# Patient Record
Sex: Male | Born: 1950 | Race: Asian | Hispanic: No | Marital: Married | Smoking: Former smoker
Health system: Southern US, Community
[De-identification: ages and names within clinical notes are randomized; demographics above are authoritative.]

## PROBLEM LIST (undated history)

## (undated) DIAGNOSIS — M1990 Unspecified osteoarthritis, unspecified site: Secondary | ICD-10-CM

## (undated) DIAGNOSIS — R339 Retention of urine, unspecified: Secondary | ICD-10-CM

## (undated) DIAGNOSIS — N400 Enlarged prostate without lower urinary tract symptoms: Secondary | ICD-10-CM

## (undated) DIAGNOSIS — Z8719 Personal history of other diseases of the digestive system: Secondary | ICD-10-CM

## (undated) DIAGNOSIS — Z972 Presence of dental prosthetic device (complete) (partial): Secondary | ICD-10-CM

## (undated) DIAGNOSIS — Z978 Presence of other specified devices: Secondary | ICD-10-CM

## (undated) DIAGNOSIS — K649 Unspecified hemorrhoids: Secondary | ICD-10-CM

## (undated) DIAGNOSIS — Z960 Presence of urogenital implants: Secondary | ICD-10-CM

## (undated) DIAGNOSIS — Z973 Presence of spectacles and contact lenses: Secondary | ICD-10-CM

## (undated) HISTORY — PX: LAPAROSCOPIC CHOLECYSTECTOMY: SUR755

---

## 2003-09-20 ENCOUNTER — Inpatient Hospital Stay (HOSPITAL_COMMUNITY): Admission: EM | Admit: 2003-09-20 | Discharge: 2003-10-04 | Payer: Self-pay | Admitting: Emergency Medicine

## 2003-09-20 ENCOUNTER — Ambulatory Visit (HOSPITAL_COMMUNITY): Admission: RE | Admit: 2003-09-20 | Discharge: 2003-09-20 | Payer: Self-pay | Admitting: Family Medicine

## 2003-09-20 ENCOUNTER — Encounter: Payer: Self-pay | Admitting: Family Medicine

## 2003-09-22 ENCOUNTER — Encounter: Payer: Self-pay | Admitting: Surgery

## 2003-09-25 ENCOUNTER — Encounter: Payer: Self-pay | Admitting: Surgery

## 2003-09-27 ENCOUNTER — Encounter: Payer: Self-pay | Admitting: Surgery

## 2003-09-28 ENCOUNTER — Encounter: Payer: Self-pay | Admitting: Surgery

## 2003-10-01 ENCOUNTER — Encounter: Payer: Self-pay | Admitting: General Surgery

## 2003-10-03 ENCOUNTER — Encounter: Payer: Self-pay | Admitting: Surgery

## 2005-12-07 ENCOUNTER — Ambulatory Visit (HOSPITAL_COMMUNITY): Admission: RE | Admit: 2005-12-07 | Discharge: 2005-12-07 | Payer: Self-pay | Admitting: Gastroenterology

## 2013-09-10 ENCOUNTER — Ambulatory Visit: Payer: No Typology Code available for payment source | Attending: Family Medicine | Admitting: Internal Medicine

## 2013-09-10 ENCOUNTER — Encounter: Payer: Self-pay | Admitting: Internal Medicine

## 2013-09-10 VITALS — BP 144/81 | HR 64 | Temp 98.0°F | Resp 14 | Ht 64.0 in | Wt 130.0 lb

## 2013-09-10 DIAGNOSIS — G8929 Other chronic pain: Secondary | ICD-10-CM | POA: Insufficient documentation

## 2013-09-10 DIAGNOSIS — N400 Enlarged prostate without lower urinary tract symptoms: Secondary | ICD-10-CM

## 2013-09-10 DIAGNOSIS — M545 Low back pain, unspecified: Secondary | ICD-10-CM | POA: Insufficient documentation

## 2013-09-10 MED ORDER — NICOTINE 21 MG/24HR TD PT24: 1.0000 | MEDICATED_PATCH | TRANSDERMAL | Status: DC

## 2013-09-10 MED ORDER — TAMSULOSIN HCL 0.4 MG PO CAPS: 0.4000 mg | ORAL_CAPSULE | Freq: Every day | ORAL | Status: DC

## 2013-09-10 MED ORDER — NAPROXEN 500 MG PO TABS: 250.0000 mg | ORAL_TABLET | Freq: Two times a day (BID) | ORAL | Status: DC | PRN

## 2013-09-10 NOTE — Progress Notes (Signed)
Pt is here to establish care. Pt reports that for 2 weeks he has pain in his lower back.

## 2013-09-10 NOTE — Progress Notes (Signed)
Patient ID: Jamie Short, male   DOB: July 03, 1951, 62 y.o.   MRN: [id number] Patient Demographics  Jamie Short, is a 62 y.o. male  WUJ:[id number]  NFA:[id number]  DOB - 1951/02/01  Chief Complaint  Patient presents with  . Establish Care        Subjective:   Jamie Short today is here to establish primary care. Patient is a 62 year old male with no significant past medical history except back pain, BPH presented to establish care He states that his back hurts on sitting down for more than an hour. States that he went to [place] 2 years ago and he was told that he had calcium buildup in his spine. He was also given medication for his prostate which helped his urination. He states that he had urinary hesitancy and urgency.  Patient has No headache, No chest pain, No abdominal pain - No Nausea, No new weakness tingling or numbness, No Cough - SOB.   Objective:    Filed Vitals:   09/10/13 1659  BP: 144/81  Pulse: 64  Temp: 98 F (36.7 C)  TempSrc: Oral  Resp: 14  Height: 5\' 4"  (1.626 m)  Weight: 130 lb (58.968 kg)  SpO2: 97%     ALLERGIES:  Not on File  PAST MEDICAL HISTORY: No past medical history on file.  PAST SURGICAL HISTORY: No past surgical history on file.  FAMILY HISTORY: No family history on file.  MEDICATIONS AT HOME: Prior to Admission medications   Medication Sig Start Date End Date Taking? Authorizing Provider  naproxen (NAPROSYN) 500 MG tablet Take 0.5 tablets (250 mg total) by mouth 2 (two) times daily as needed (FOR PAIN). 09/10/13   Ripudeep Jenna Luo, MD  nicotine (NICODERM CQ) 21 mg/24hr patch Place 1 patch onto the skin daily. 09/10/13   Ripudeep Jenna Luo, MD  tamsulosin (FLOMAX) 0.4 MG CAPS capsule Take 1 capsule (0.4 mg total) by mouth daily. 09/10/13   Ripudeep Jenna Luo, MD    REVIEW OF SYSTEMS:  Constitutional:   No   Fevers, chills, fatigue.  HEENT:    No headaches, Sore throat,   Cardio-vascular: No chest pain,  Orthopnea, swelling in lower extremities,  anasarca, palpitations  GI:  No abdominal pain, nausea, vomiting, diarrhea  Resp: No shortness of breath,  No coughing up of blood.No cough.No wheezing.  Skin:  no rash or lesions.  GU:  no dysuria, change in color of urine, no urgency or frequency.  No flank pain.  Musculoskeletal: No joint pain or swelling.  No decreased range of motion.   No spinal tenderness please see history of present illness  Psych: No change in mood or affect. No depression or anxiety.  No memory loss.   Exam  General appearance :Awake, alert, NAD, Speech Clear. HEENT: Atraumatic and Normocephalic, PERLA Neck: supple, no JVD. No cervical lymphadenopathy.  Chest: clear to auscultation bilaterally, no wheezing, rales or rhonchi CVS: S1 S2 regular, no murmurs.  Abdomen: soft, NBS, NT, ND, no gaurding, rigidity or rebound. Extremities: No cyanosis, clubbing, B/L Lower Ext shows no edema,  Neurology: Awake alert, and oriented X 3, CN II-XII intact, Non focal Skin:No Rash or lesions Wounds: N/A Back no spinal tenderness noted, patient had Lidoderm patch.   Data Review   Basic Metabolic Panel: No results found for this basename: NA, K, CL, CO2, GLUCOSE, BUN, CREATININE, CALCIUM, MG, PHOS,  in the last 168 hours Liver Function Tests: No results found for this basename: AST, ALT, ALKPHOS, BILITOT, PROT, ALBUMIN,  in the last 168 hours  CBC: No results found for this basename: WBC, NEUTROABS, HGB, HCT, MCV, PLT,  in the last 168 hours ------------------------------------------------------------------------------------------------------------------ No results found for this basename: HGBA1C,  in the last 72 hours ------------------------------------------------------------------------------------------------------------------ No results found for this basename: CHOL, HDL, LDLCALC, TRIG, CHOLHDL, LDLDIRECT,  in the last 72  hours ------------------------------------------------------------------------------------------------------------------ No results found for this basename: TSH, T4TOTAL, FREET3, T3FREE, THYROIDAB,  in the last 72 hours ------------------------------------------------------------------------------------------------------------------ No results found for this basename: VITAMINB12, FOLATE, FERRITIN, TIBC, IRON, RETICCTPCT,  in the last 72 hours  Coagulation profile  No results found for this basename: INR, PROTIME,  in the last 168 hours    Assessment & Plan   Active Problems:   Chronic Thoraco-lumbar spine back pain Obtain x-rays of the thoracic and lumbar spine. Placed on Naprosyn 250 mg by mouth BID PRN for pain  BPH: Obtain PSA, place on Flomax    Health screening : next visit  Recommendations:Ordered CBC, CMET, lipid panel, PSA, vitamin D level    Follow-up in 1 MONTH   RAI,RIPUDEEP M.D. 09/10/2013, 5:15 PM

## 2013-10-10 ENCOUNTER — Ambulatory Visit: Payer: No Typology Code available for payment source | Attending: Internal Medicine

## 2013-10-10 DIAGNOSIS — N400 Enlarged prostate without lower urinary tract symptoms: Secondary | ICD-10-CM

## 2013-10-10 DIAGNOSIS — M545 Low back pain: Secondary | ICD-10-CM

## 2013-10-10 LAB — COMPREHENSIVE METABOLIC PANEL
ALT: 18 U/L (ref 0–53)
AST: 19 U/L (ref 0–37)
Albumin: 4.2 g/dL (ref 3.5–5.2)
BUN: 24 mg/dL — ABNORMAL HIGH (ref 6–23)
Chloride: 105 mEq/L (ref 96–112)
Creat: 0.86 mg/dL (ref 0.50–1.35)
Glucose, Bld: 89 mg/dL (ref 70–99)
Potassium: 4.5 mEq/L (ref 3.5–5.3)
Total Bilirubin: 0.6 mg/dL (ref 0.3–1.2)

## 2013-10-10 LAB — CBC WITH DIFFERENTIAL/PLATELET
Basophils Absolute: 0.1 10*3/uL (ref 0.0–0.1)
Eosinophils Absolute: 0.9 10*3/uL — ABNORMAL HIGH (ref 0.0–0.7)
Eosinophils Relative: 9 % — ABNORMAL HIGH (ref 0–5)
MCH: 31.3 pg (ref 26.0–34.0)
MCHC: 34.3 g/dL (ref 30.0–36.0)
MCV: 91.2 fL (ref 78.0–100.0)
Monocytes Absolute: 0.7 10*3/uL (ref 0.1–1.0)
Neutro Abs: 7.4 10*3/uL (ref 1.7–7.7)
Platelets: 299 10*3/uL (ref 150–400)
RDW: 14.1 % (ref 11.5–15.5)

## 2013-10-10 LAB — PSA: PSA: 3.62 ng/mL (ref <=4.00)

## 2013-10-10 LAB — LIPID PANEL: LDL Cholesterol: 96 mg/dL (ref 0–99)

## 2013-10-11 LAB — VITAMIN D 25 HYDROXY (VIT D DEFICIENCY, FRACTURES): Vit D, 25-Hydroxy: 34 ng/mL (ref 30–89)

## 2013-10-13 ENCOUNTER — Ambulatory Visit: Payer: No Typology Code available for payment source | Attending: Internal Medicine | Admitting: Internal Medicine

## 2013-10-13 ENCOUNTER — Encounter: Payer: Self-pay | Admitting: Internal Medicine

## 2013-10-13 ENCOUNTER — Ambulatory Visit: Payer: No Typology Code available for payment source

## 2013-10-13 DIAGNOSIS — M545 Low back pain: Secondary | ICD-10-CM

## 2013-10-13 NOTE — Progress Notes (Signed)
Patient here for follow up on his back pain And for lab results

## 2013-10-13 NOTE — Progress Notes (Signed)
Patient ID: Jamie Short, male   DOB: 1951-06-02, 62 y.o.   MRN: [id number] Patient Demographics  Jamie Short, is a 62 y.o. male  WUJ:[id number]  NFA:[id number]  DOB - 08/09/1951  Chief Complaint  Patient presents with  . Follow-up        Subjective:   Jamie Short is a 62 y.o. male here today for a follow up visit. Patient wants to know the results of labs recently ordered. He is yet to go for his x-ray. No new complaints today. Patient claims pain is less. He is eating and drinking well. He still continue to smoke half a pack of cigarettes per day though he is trying to quit. He has prescription for nicotine patch. Patient has No headache, No chest pain, No abdominal pain - No Nausea, No new weakness tingling or numbness, No Cough - SOB.  ALLERGIES: No Known Allergies  PAST MEDICAL HISTORY: History reviewed. No pertinent past medical history.  MEDICATIONS AT HOME: Prior to Admission medications   Medication Sig Start Date End Date Taking? Authorizing Provider  naproxen (NAPROSYN) 500 MG tablet Take 0.5 tablets (250 mg total) by mouth 2 (two) times daily as needed (FOR PAIN). 09/10/13   Ripudeep Jenna Luo, MD  nicotine (NICODERM CQ) 21 mg/24hr patch Place 1 patch onto the skin daily. 09/10/13   Ripudeep Jenna Luo, MD  tamsulosin (FLOMAX) 0.4 MG CAPS capsule Take 1 capsule (0.4 mg total) by mouth daily. 09/10/13   Ripudeep Jenna Luo, MD     Objective:   There were no vitals filed for this visit.  Exam General appearance : Awake, alert, not in any distress. Speech Clear. Not toxic looking HEENT: Atraumatic and Normocephalic, pupils equally reactive to light and accomodation Neck: supple, no JVD. No cervical lymphadenopathy.  Chest:Good air entry bilaterally, no added sounds  CVS: S1 S2 regular, no murmurs.  Abdomen: Bowel sounds present, Non tender and not distended with no gaurding, rigidity or rebound. Extremities: B/L Lower Ext shows no edema, both legs are warm to touch Neurology: Awake alert, and  oriented X 3, CN II-XII intact, Non focal Skin:No Rash Wounds:N/A   Data Review   CBC  Recent Labs Lab 10/10/13 0926  WBC 10.5  HGB 16.0  HCT 46.7  PLT 299  MCV 91.2  MCH 31.3  MCHC 34.3  RDW 14.1  LYMPHSABS 1.4  MONOABS 0.7  EOSABS 0.9*  BASOSABS 0.1    Chemistries    Recent Labs Lab 10/10/13 0926  NA 141  K 4.5  CL 105  CO2 29  GLUCOSE 89  BUN 24*  CREATININE 0.86  CALCIUM 9.6  AST 19  ALT 18  ALKPHOS 63  BILITOT 0.6   ------------------------------------------------------------------------------------------------------------------ No results found for this basename: HGBA1C,  in the last 72 hours ------------------------------------------------------------------------------------------------------------------ No results found for this basename: CHOL, HDL, LDLCALC, TRIG, CHOLHDL, LDLDIRECT,  in the last 72 hours ------------------------------------------------------------------------------------------------------------------ No results found for this basename: TSH, T4TOTAL, FREET3, T3FREE, THYROIDAB,  in the last 72 hours ------------------------------------------------------------------------------------------------------------------ No results found for this basename: VITAMINB12, FOLATE, FERRITIN, TIBC, IRON, RETICCTPCT,  in the last 72 hours  Coagulation profile  No results found for this basename: INR, PROTIME,  in the last 168 hours    Assessment & Plan   Patient Active Problem List   Diagnosis Date Noted  . Back pain of thoracolumbar region 09/10/2013  . BPH (benign prostatic hypertrophy) 09/10/2013     Plan: We had reviewed the lab results together Patient encouraged to continue present  line of medications and management Patient encouraged to go for x-ray of the thoracic and lumbar spine as ordered  Patient has been counseled about nutrition and exercise  We will give shots for pneumonia and shingles in the next visit, patient  already got influenza shot   Follow up in 3 months or when necessary   The patient was given clear instructions to go to ER or return to medical center if symptoms don't improve, worsen or new problems develop. The patient verbalized understanding. The patient was told to call to get lab results if they haven't heard anything in the next week.    Jeanann Lewandowsky, MD, MHA, FACP, FAAP [facility] [place], [place] [phone]   10/13/2013, 4:04 PM

## 2014-01-12 ENCOUNTER — Ambulatory Visit: Payer: No Typology Code available for payment source | Admitting: Internal Medicine

## 2014-02-17 ENCOUNTER — Encounter (HOSPITAL_COMMUNITY): Payer: Self-pay | Admitting: Emergency Medicine

## 2014-02-17 ENCOUNTER — Emergency Department (HOSPITAL_COMMUNITY)
Admission: EM | Admit: 2014-02-17 | Discharge: 2014-02-17 | Disposition: A | Discharge status: Home / Self Care | Payer: No Typology Code available for payment source | Source: Home / Self Care | Attending: Family Medicine | Admitting: Family Medicine

## 2014-02-17 DIAGNOSIS — N400 Enlarged prostate without lower urinary tract symptoms: Secondary | ICD-10-CM

## 2014-02-17 DIAGNOSIS — K644 Residual hemorrhoidal skin tags: Secondary | ICD-10-CM

## 2014-02-17 LAB — POCT URINALYSIS DIP (DEVICE)
Bilirubin Urine: NEGATIVE
Glucose, UA: NEGATIVE mg/dL
Ketones, ur: NEGATIVE mg/dL
Leukocytes, UA: NEGATIVE
NITRITE: NEGATIVE
PROTEIN: NEGATIVE mg/dL
Specific Gravity, Urine: 1.02 (ref 1.005–1.030)
UROBILINOGEN UA: 0.2 mg/dL (ref 0.0–1.0)
pH: 7 (ref 5.0–8.0)

## 2014-02-17 MED ORDER — HYDROCORTISONE ACETATE 25 MG RE SUPP: 25.0000 mg | Freq: Two times a day (BID) | RECTAL | Status: DC | PRN

## 2014-02-17 MED ORDER — TAMSULOSIN HCL 0.4 MG PO CAPS: 0.4000 mg | ORAL_CAPSULE | Freq: Every day | ORAL | Status: DC

## 2014-02-17 NOTE — ED Notes (Signed)
Pt  Reports  Frequent  Scanty  painfull        Urination    For  sev  Days  As  Well  As  hemmoriod  Problems         History of  [rostate   Problems      Son is  Present  To  Interpret

## 2014-02-17 NOTE — ED Provider Notes (Signed)
CSN: [id number]     Arrival date & time 02/17/14  1005 History   First MD Initiated Contact with Patient 02/17/14 1035     Chief Complaint  Patient presents with  . Hemorrhoids   (Consider location/radiation/quality/duration/timing/severity/associated sxs/prior Treatment) Patient is a 63 y.o. male presenting with hematochezia. The history is provided by the patient.  Rectal Bleeding Quality:  Bright red Duration:  2 months Chronicity:  Chronic Context: hemorrhoids and rectal pain   Relieved by:  None tried Worsened by:  Nothing tried Associated symptoms: no fever and no vomiting     History reviewed. No pertinent past medical history. History reviewed. No pertinent past surgical history. History reviewed. No pertinent family history. History  Substance Use Topics  . Smoking status: Current Every Day Smoker -- 0.50 packs/day    Types: Cigarettes  . Smokeless tobacco: Not on file     Comment: smokes 10cigs/day  . Alcohol Use: No    Review of Systems  Constitutional: Negative.  Negative for fever.  Gastrointestinal: Positive for hematochezia. Negative for vomiting.  Genitourinary: Negative.     Allergies  Review of patient's allergies indicates no known allergies.  Home Medications   Current Outpatient Rx  Name  Route  Sig  Dispense  Refill  . hydrocortisone (ANUSOL-HC) 25 MG suppository   Rectal   Place 1 suppository (25 mg total) rectally 2 (two) times daily as needed for hemorrhoids or itching.   12 suppository   1   . naproxen (NAPROSYN) 500 MG tablet   Oral   Take 0.5 tablets (250 mg total) by mouth 2 (two) times daily as needed (FOR PAIN).   60 tablet   3   . nicotine (NICODERM CQ) 21 mg/24hr patch   Transdermal   Place 1 patch onto the skin daily.   28 patch   0   . tamsulosin (FLOMAX) 0.4 MG CAPS capsule   Oral   Take 1 capsule (0.4 mg total) by mouth daily.   30 capsule   3   . tamsulosin (FLOMAX) 0.4 MG CAPS capsule   Oral   Take 1 capsule  (0.4 mg total) by mouth daily after breakfast.   30 capsule   1    BP 140/72  Pulse 78  Temp(Src) 98.6 F (37 C) (Oral)  Resp 18  SpO2 98% Physical Exam  Nursing note and vitals reviewed. Constitutional: He is oriented to person, place, and time.  Genitourinary: Testes normal and penis normal. Rectal exam shows external hemorrhoid. Rectal exam shows no tenderness. Prostate is tender. Prostate is not enlarged.  Neurological: He is alert and oriented to person, place, and time.  Skin: Skin is warm and dry.    ED Course  Procedures (including critical care time) Labs Review Labs Reviewed  POCT URINALYSIS DIP (DEVICE) - Abnormal; Notable for the following:    Hgb urine dipstick TRACE (*)    All other components within normal limits   Imaging Review No results found.   MDM   1. BPH without urinary obstruction   2. External hemorrhoid        Billy Fischer, MD 02/17/14 1100

## 2014-02-24 ENCOUNTER — Emergency Department (HOSPITAL_COMMUNITY)
Admission: EM | Admit: 2014-02-24 | Discharge: 2014-02-24 | Disposition: A | Discharge status: Home / Self Care | Payer: No Typology Code available for payment source | Source: Home / Self Care

## 2014-02-24 ENCOUNTER — Encounter (HOSPITAL_COMMUNITY): Payer: Self-pay | Admitting: Emergency Medicine

## 2014-02-24 DIAGNOSIS — R238 Other skin changes: Secondary | ICD-10-CM

## 2014-02-24 DIAGNOSIS — T148XXA Other injury of unspecified body region, initial encounter: Secondary | ICD-10-CM

## 2014-02-24 HISTORY — DX: Benign prostatic hyperplasia without lower urinary tract symptoms: N40.0

## 2014-02-24 HISTORY — DX: Unspecified hemorrhoids: K64.9

## 2014-02-24 MED ORDER — TETANUS-DIPHTH-ACELL PERTUSSIS 5-2.5-18.5 LF-MCG/0.5 IM SUSP
INTRAMUSCULAR | Status: AC
  Filled 2014-02-24: qty 0.5

## 2014-02-24 MED ORDER — CEPHALEXIN 500 MG PO CAPS: 500.0000 mg | ORAL_CAPSULE | Freq: Two times a day (BID) | ORAL | Status: DC

## 2014-02-24 MED ORDER — TETANUS-DIPHTH-ACELL PERTUSSIS 5-2.5-18.5 LF-MCG/0.5 IM SUSP
0.5000 mL | Freq: Once | INTRAMUSCULAR | Status: AC
  Administered 2014-02-24: 0.5 mL via INTRAMUSCULAR
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
## 2014-02-24 NOTE — ED Notes (Signed)
Patient has multiple red areas to his back Caused by some type of culture therapy Has been this way since yesterday Has been using OTC antibiotic cream

## 2014-02-25 NOTE — ED Provider Notes (Signed)
Medical screening examination/treatment/procedure(s) were performed by a resident physician or non-physician practitioner and as the supervising physician I was immediately available for consultation/collaboration.  Lynne Leader, MD    Gregor Hams, MD 02/25/14 [phone]

## 2014-09-24 DIAGNOSIS — Z23 Encounter for immunization: Secondary | ICD-10-CM | POA: Diagnosis not present

## 2014-10-08 ENCOUNTER — Ambulatory Visit (HOSPITAL_COMMUNITY)
Admission: RE | Admit: 2014-10-08 | Discharge: 2014-10-08 | Disposition: A | Discharge status: Home / Self Care | Payer: Medicare Other | Source: Ambulatory Visit | Attending: Cardiology | Admitting: Cardiology

## 2014-10-08 ENCOUNTER — Encounter: Payer: Self-pay | Admitting: Internal Medicine

## 2014-10-08 ENCOUNTER — Ambulatory Visit: Payer: Medicare Other | Attending: Internal Medicine | Admitting: Internal Medicine

## 2014-10-08 ENCOUNTER — Other Ambulatory Visit: Payer: Self-pay

## 2014-10-08 VITALS — BP 135/79 | HR 72 | Temp 97.8°F | Resp 16 | Ht 64.0 in | Wt 138.0 lb

## 2014-10-08 DIAGNOSIS — R0602 Shortness of breath: Secondary | ICD-10-CM

## 2014-10-08 DIAGNOSIS — Z23 Encounter for immunization: Secondary | ICD-10-CM | POA: Diagnosis not present

## 2014-10-08 DIAGNOSIS — N400 Enlarged prostate without lower urinary tract symptoms: Secondary | ICD-10-CM

## 2014-10-08 DIAGNOSIS — Z0000 Encounter for general adult medical examination without abnormal findings: Secondary | ICD-10-CM | POA: Diagnosis not present

## 2014-10-08 DIAGNOSIS — Z1211 Encounter for screening for malignant neoplasm of colon: Secondary | ICD-10-CM

## 2014-10-08 DIAGNOSIS — M545 Low back pain: Secondary | ICD-10-CM | POA: Diagnosis not present

## 2014-10-08 DIAGNOSIS — I4510 Unspecified right bundle-branch block: Secondary | ICD-10-CM | POA: Diagnosis not present

## 2014-10-08 DIAGNOSIS — M546 Pain in thoracic spine: Principal | ICD-10-CM

## 2014-10-08 LAB — COMPLETE METABOLIC PANEL WITH GFR
ALT: 19 U/L (ref 0–53)
AST: 19 U/L (ref 0–37)
Albumin: 4.3 g/dL (ref 3.5–5.2)
Alkaline Phosphatase: 60 U/L (ref 39–117)
BUN: 19 mg/dL (ref 6–23)
CALCIUM: 9.3 mg/dL (ref 8.4–10.5)
CHLORIDE: 103 meq/L (ref 96–112)
CO2: 26 mEq/L (ref 19–32)
Creat: 0.92 mg/dL (ref 0.50–1.35)
GFR, EST NON AFRICAN AMERICAN: 87 mL/min
GFR, Est African American: >89 mL/min
GLUCOSE: 95 mg/dL (ref 70–99)
POTASSIUM: 4.6 meq/L (ref 3.5–5.3)
SODIUM: 138 meq/L (ref 135–145)
TOTAL PROTEIN: 7.1 g/dL (ref 6.0–8.3)
Total Bilirubin: 0.3 mg/dL (ref 0.2–1.2)

## 2014-10-08 MED ORDER — TAMSULOSIN HCL 0.4 MG PO CAPS: 0.4000 mg | ORAL_CAPSULE | Freq: Every day | ORAL | Status: DC

## 2014-10-08 MED ORDER — NAPROXEN 500 MG PO TABS: 250.0000 mg | ORAL_TABLET | Freq: Two times a day (BID) | ORAL | Status: DC | PRN

## 2014-10-08 NOTE — Progress Notes (Signed)
Pt is here c/o difficulty urinating. Pt states that he has pain and numbness in his lower back. Pt is requesting a pneumonia shot.

## 2014-10-08 NOTE — Progress Notes (Signed)
Patient ID: Jamie Short, male   DOB: 22-Jul-1951, 63 y.o.   MRN: [id number]   Jamie Short, is a 63 y.o. male  FYB:[id number]  NID:[id number]  DOB - [birth year]  Chief Complaint  Patient presents with  . Follow-up        Subjective:   Jamie Short is a 63 y.o. male here today for a follow up visit. Patient has history of BPH here today with complaint of difficulty urinating. He states that he has pain in his lower back, chronic. Also complaining of some shortness of breath and cough requesting Pneumovax. Patient has not had colonoscopy. Patient is on Anusol suppository for hemorrhoids. Patient has No headache, No chest pain, No abdominal pain - No Nausea, No new weakness tingling or numbness, No Cough - SOB.  Problem  Colon Cancer Screening    ALLERGIES: No Known Allergies  PAST MEDICAL HISTORY: Past Medical History  Diagnosis Date  . Hemorrhoids   . BPH (benign prostatic hyperplasia)     MEDICATIONS AT HOME: Prior to Admission medications   Medication Sig Start Date End Date Taking? Authorizing Provider  hydrocortisone (ANUSOL-HC) 25 MG suppository Place 1 suppository (25 mg total) rectally 2 (two) times daily as needed for hemorrhoids or itching. 02/17/14   Billy Fischer, MD  naproxen (NAPROSYN) 500 MG tablet Take 0.5 tablets (250 mg total) by mouth 2 (two) times daily as needed (FOR PAIN). 10/08/14   Tresa Garter, MD  nicotine (NICODERM CQ) 21 mg/24hr patch Place 1 patch onto the skin daily. 09/10/13   Ripudeep Krystal Eaton, MD  tamsulosin (FLOMAX) 0.4 MG CAPS capsule Take 1 capsule (0.4 mg total) by mouth daily after breakfast. 10/08/14   Tresa Garter, MD     Objective:   Filed Vitals:   10/08/14 1604  BP: 135/79  Pulse: 72  Temp: 97.8 F (36.6 C)  TempSrc: Oral  Resp: 16  Height: 5\' 4"  (1.626 m)  Weight: 138 lb (62.596 kg)  SpO2: 96%    Exam General appearance : Awake, alert, not in any distress. Speech Clear. Not toxic looking HEENT: Atraumatic and Normocephalic,  pupils equally reactive to light and accomodation Neck: supple, no JVD. No cervical lymphadenopathy.  Chest:Good air entry bilaterally, no added sounds  CVS: S1 S2 regular, no murmurs.  Abdomen: Bowel sounds present, Non tender and not distended with no gaurding, rigidity or rebound. Extremities: B/L Lower Ext shows no edema, both legs are warm to touch Neurology: Awake alert, and oriented X 3, CN II-XII intact, Non focal  Data Review No results found for this basename: HGBA1C     Assessment & Plan   1. Back pain of thoracolumbar region  - naproxen (NAPROSYN) 500 MG tablet; Take 0.5 tablets (250 mg total) by mouth 2 (two) times daily as needed (FOR PAIN).  Dispense: 60 tablet; Refill: 3  2. BPH (benign prostatic hypertrophy)  - Urinalysis, Complete - COMPLETE METABOLIC PANEL WITH GFR - tamsulosin (FLOMAX) 0.4 MG CAPS capsule; Take 1 capsule (0.4 mg total) by mouth daily after breakfast.  Dispense: 90 capsule; Refill: 3  3. Colon cancer screening  - HM COLONOSCOPY - Ambulatory referral to Gastroenterology  4. SOB (shortness of breath)  - DG Chest 2 View; Future  Interpreter was used to communicate directly with patient for the entire encounter including providing detailed patient instructions.   Return in about 6 months (around 04/09/2015), or if symptoms worsen or fail to improve, for Follow up Pain and comorbidities, BPH.  The  patient was given clear instructions to go to ER or return to medical center if symptoms don't improve, worsen or new problems develop. The patient verbalized understanding. The patient was told to call to get lab results if they haven't heard anything in the next week.   This note has been created with Surveyor, quantity. Any transcriptional errors are unintentional.    Angelica Chessman, MD, [place], [place], [place] and [place], [place]   10/08/2014, 4:47  PM

## 2014-10-09 LAB — URINALYSIS, COMPLETE
Bacteria, UA: NONE SEEN
Bilirubin Urine: NEGATIVE
Casts: NONE SEEN
Glucose, UA: NEGATIVE mg/dL
HGB URINE DIPSTICK: NEGATIVE
Ketones, ur: NEGATIVE mg/dL
Leukocytes, UA: NEGATIVE
NITRITE: NEGATIVE
PROTEIN: NEGATIVE mg/dL
SQUAMOUS EPITHELIAL / LPF: NONE SEEN
Specific Gravity, Urine: 1.025 (ref 1.005–1.030)
UROBILINOGEN UA: 0.2 mg/dL (ref 0.0–1.0)
pH: 6.5 (ref 5.0–8.0)

## 2015-02-14 ENCOUNTER — Emergency Department (HOSPITAL_COMMUNITY)
Admission: EM | Admit: 2015-02-14 | Discharge: 2015-02-14 | Disposition: A | Discharge status: Home / Self Care | Payer: Medicare Other | Attending: Emergency Medicine | Admitting: Emergency Medicine

## 2015-02-14 ENCOUNTER — Encounter (HOSPITAL_COMMUNITY): Payer: Self-pay | Admitting: Emergency Medicine

## 2015-02-14 DIAGNOSIS — Z720 Tobacco use: Secondary | ICD-10-CM | POA: Diagnosis not present

## 2015-02-14 DIAGNOSIS — Z7952 Long term (current) use of systemic steroids: Secondary | ICD-10-CM | POA: Insufficient documentation

## 2015-02-14 DIAGNOSIS — N400 Enlarged prostate without lower urinary tract symptoms: Secondary | ICD-10-CM | POA: Insufficient documentation

## 2015-02-14 DIAGNOSIS — R32 Unspecified urinary incontinence: Secondary | ICD-10-CM | POA: Diagnosis not present

## 2015-02-14 DIAGNOSIS — R339 Retention of urine, unspecified: Secondary | ICD-10-CM | POA: Diagnosis not present

## 2015-02-14 DIAGNOSIS — Z79899 Other long term (current) drug therapy: Secondary | ICD-10-CM | POA: Insufficient documentation

## 2015-02-14 DIAGNOSIS — R109 Unspecified abdominal pain: Secondary | ICD-10-CM | POA: Diagnosis not present

## 2015-02-14 DIAGNOSIS — Z8719 Personal history of other diseases of the digestive system: Secondary | ICD-10-CM | POA: Diagnosis not present

## 2015-02-14 LAB — URINE MICROSCOPIC-ADD ON

## 2015-02-14 LAB — URINALYSIS, ROUTINE W REFLEX MICROSCOPIC
BILIRUBIN URINE: NEGATIVE
Glucose, UA: NEGATIVE mg/dL
Ketones, ur: NEGATIVE mg/dL
Leukocytes, UA: NEGATIVE
NITRITE: NEGATIVE
PH: 7 (ref 5.0–8.0)
PROTEIN: NEGATIVE mg/dL
Specific Gravity, Urine: 1.018 (ref 1.005–1.030)
UROBILINOGEN UA: 0.2 mg/dL (ref 0.0–1.0)

## 2015-02-14 LAB — I-STAT CHEM 8, ED
BUN: 23 mg/dL (ref 6–23)
CALCIUM ION: 1.17 mmol/L (ref 1.13–1.30)
Chloride: 102 mmol/L (ref 96–112)
Creatinine, Ser: 0.9 mg/dL (ref 0.50–1.35)
GLUCOSE: 115 mg/dL — AB (ref 70–99)
HCT: 49 % (ref 39.0–52.0)
Hemoglobin: 16.7 g/dL (ref 13.0–17.0)
Potassium: 3.8 mmol/L (ref 3.5–5.1)
Sodium: 140 mmol/L (ref 135–145)
TCO2: 25 mmol/L (ref 0–100)

## 2015-02-14 NOTE — ED Notes (Signed)
Per EMS-patient here with complaints of lower abd pain, difficulty urinating. States that last time urinated was yesterday afternoon. Denies blood in urine. Hx of prostate issues.

## 2015-02-14 NOTE — ED Provider Notes (Signed)
CSN: [id number]     Arrival date & time 02/14/15  1159 History   First MD Initiated Contact with Patient 02/14/15 1205     Chief Complaint  Patient presents with  . Abdominal Pain  . Urinary Retention     (Consider location/radiation/quality/duration/timing/severity/associated sxs/prior Treatment) HPI Comments: Patient here complaining of urinary retention 24 hours. History of enlarged prostate and has just run out of his Flomax. No flank pain or fever. No vomiting or diarrhea. Does note suprapubic fullness. Symptoms persistent and he has noted some dribbling without dysuria. Called EMS was transported here. No history of hematuria  Patient is a 64 y.o. male presenting with abdominal pain. The history is provided by the patient and a relative.  Abdominal Pain   Past Medical History  Diagnosis Date  . Hemorrhoids   . BPH (benign prostatic hyperplasia)    History reviewed. No pertinent past surgical history. No family history on file. History  Substance Use Topics  . Smoking status: Current Every Day Smoker -- 0.50 packs/day    Types: Cigarettes  . Smokeless tobacco: Not on file     Comment: smokes 10cigs/day  . Alcohol Use: No    Review of Systems  Gastrointestinal: Positive for abdominal pain.  All other systems reviewed and are negative.     Allergies  Review of patient's allergies indicates no known allergies.  Home Medications   Prior to Admission medications   Medication Sig Start Date End Date Taking? Authorizing Provider  hydrocortisone (ANUSOL-HC) 25 MG suppository Place 1 suppository (25 mg total) rectally 2 (two) times daily as needed for hemorrhoids or itching. 02/17/14   Billy Fischer, MD  naproxen (NAPROSYN) 500 MG tablet Take 0.5 tablets (250 mg total) by mouth 2 (two) times daily as needed (FOR PAIN). 10/08/14   Tresa Garter, MD  nicotine (NICODERM CQ) 21 mg/24hr patch Place 1 patch onto the skin daily. 09/10/13   Ripudeep Krystal Eaton, MD  tamsulosin  (FLOMAX) 0.4 MG CAPS capsule Take 1 capsule (0.4 mg total) by mouth daily after breakfast. 10/08/14   Tresa Garter, MD   There were no vitals taken for this visit. Physical Exam  Constitutional: He is oriented to person, place, and time. He appears well-developed and well-nourished.  Non-toxic appearance. No distress.  HENT:  Head: Normocephalic and atraumatic.  Eyes: Conjunctivae, EOM and lids are normal. Pupils are equal, round, and reactive to light.  Neck: Normal range of motion. Neck supple. No tracheal deviation present. No thyroid mass present.  Cardiovascular: Normal rate, regular rhythm and normal heart sounds.  Exam reveals no gallop.   No murmur heard. Pulmonary/Chest: Effort normal and breath sounds normal. No stridor. No respiratory distress. He has no decreased breath sounds. He has no wheezes. He has no rhonchi. He has no rales.  Abdominal: Soft. Normal appearance and bowel sounds are normal. He exhibits no distension. There is tenderness in the suprapubic area. There is no rigidity, no rebound, no guarding and no CVA tenderness.    Musculoskeletal: Normal range of motion. He exhibits no edema or tenderness.  Neurological: He is alert and oriented to person, place, and time. He has normal strength. No cranial nerve deficit or sensory deficit. GCS eye subscore is 4. GCS verbal subscore is 5. GCS motor subscore is 6.  Skin: Skin is warm and dry. No abrasion and no rash noted.  Psychiatric: He has a normal mood and affect. His speech is normal and behavior is normal.  Nursing note  and vitals reviewed.   ED Course  Procedures (including critical care time) Labs Review Labs Reviewed  URINE CULTURE  URINALYSIS, ROUTINE W REFLEX MICROSCOPIC  I-STAT CHEM 8, ED    Imaging Review No results found.   EKG Interpretation None      MDM   Final diagnoses:  None    Foley catheter placed by nursing and copious amounts of urine drained. Patient's renal function  normal. Urinalysis negative for infection. Will be given referral to urology and will go home with Foley leg bag    Leota Jacobsen, MD 02/14/15 1410

## 2015-02-14 NOTE — Discharge Instructions (Signed)
Acute Urinary Retention °Acute urinary retention is the temporary inability to urinate. °This is a common problem in older men. As men age their prostates become larger and block the flow of urine from the bladder. This is usually a problem that has come on gradually.  °HOME CARE INSTRUCTIONS °If you are sent home with a Foley catheter and a drainage system, you will need to discuss the best course of action with your health care provider. While the catheter is in, maintain a good intake of fluids. Keep the drainage bag emptied and lower than your catheter. This is so that contaminated urine will not flow back into your bladder, which could lead to a urinary tract infection. °There are two main types of drainage bags. One is a large bag that usually is used at night. It has a good capacity that will allow you to sleep through the night without having to empty it. The second type is called a leg bag. It has a smaller capacity, so it needs to be emptied more frequently. However, the main advantage is that it can be attached by a leg strap and can go underneath your clothing, allowing you the freedom to move about or leave your home. °Only take over-the-counter or prescription medicines for pain, discomfort, or fever as directed by your health care provider.  °SEEK MEDICAL CARE IF: °· You develop a low-grade fever. °· You experience spasms or leakage of urine with the spasms. °SEEK IMMEDIATE MEDICAL CARE IF:  °1. You develop chills or fever. °2. Your catheter stops draining urine. °3. Your catheter falls out. °4. You start to develop increased bleeding that does not respond to rest and increased fluid intake. °MAKE SURE YOU: °1. Understand these instructions. °2. Will watch your condition. °3. Will get help right away if you are not doing well or get worse. °Document Released: 03/05/2001 Document Revised: 12/02/2013 Document Reviewed: 05/08/2013 °ExitCare® Patient Information ©2015 [employer]. This information is  not intended to replace advice given to you by your health care provider. Make sure you discuss any questions you have with your health care provider. °Foley Catheter Care °A Foley catheter is a soft, flexible tube that is placed into the bladder to drain urine. A Foley catheter may be inserted if: °· You leak urine or are not able to control when you urinate (urinary incontinence). °· You are not able to urinate when you need to (urinary retention). °· You had prostate surgery or surgery on the genitals. °· You have certain medical conditions, such as multiple sclerosis, dementia, or a spinal cord injury. °If you are going home with a Foley catheter in place, follow the instructions below. °TAKING CARE OF THE CATHETER °5. Wash your hands with soap and water. °6. Using mild soap and warm water on a clean washcloth: °· Clean the area on your body closest to the catheter insertion site using a circular motion, moving away from the catheter. Never wipe toward the catheter because this could sweep bacteria up into the urethra and cause infection. °· Remove all traces of soap. Pat the area dry with a clean towel. For males, reposition the foreskin. °7. Attach the catheter to your leg so there is no tension on the catheter. Use adhesive tape or a leg strap. If you are using adhesive tape, remove any sticky residue left behind by the previous tape you used. °8. Keep the drainage bag below the level of the bladder, but keep it off the floor. °9. Check throughout   the day to be sure the catheter is working and urine is draining freely. Make sure the tubing does not become kinked. °10. Do not pull on the catheter or try to remove it. Pulling could damage internal tissues. °TAKING CARE OF THE DRAINAGE BAGS °You will be given two drainage bags to take home. One is a large overnight drainage bag, and the other is a smaller leg bag that fits underneath clothing. You may wear the overnight bag at any time, but you should never wear  the smaller leg bag at night. Follow the instructions below for how to empty, change, and clean your drainage bags. °Emptying the Drainage Bag °You must empty your drainage bag when it is  -½ full or at least 2-3 times a day. °4. Wash your hands with soap and water. °5. Keep the drainage bag below your hips, below the level of your bladder. This stops urine from going back into the tubing and into your bladder. °6. Hold the dirty bag over the toilet or a clean container. °7. Open the pour spout at the bottom of the bag and empty the urine into the toilet or container. Do not let the pour spout touch the toilet, container, or any other surface. Doing so can place bacteria on the bag, which can cause an infection. °8. Clean the pour spout with a gauze pad or cotton ball that has rubbing alcohol on it. °9. Close the pour spout. °10. Attach the bag to your leg with adhesive tape or a leg strap. °11. Wash your hands well. °Changing the Drainage Bag °Change your drainage bag once a month or sooner if it starts to smell bad or look dirty. Below are steps to follow when changing the drainage bag. °1. Wash your hands with soap and water. °2. Pinch off the rubber catheter so that urine does not spill out. °3. Disconnect the catheter tube from the drainage tube at the connection valve. Do not let the tubes touch any surface. °4. Clean the end of the catheter tube with an alcohol wipe. Use a different alcohol wipe to clean the end of the drainage tube. °5. Connect the catheter tube to the drainage tube of the clean drainage bag. °6. Attach the new bag to the leg with adhesive tape or a leg strap. Avoid attaching the new bag too tightly. °7. Wash your hands well. °Cleaning the Drainage Bag °1. Wash your hands with soap and water. °2. Wash the bag in warm, soapy water. °3. Rinse the bag thoroughly with warm water. °4. Fill the bag with a solution of white vinegar and water (1 cup vinegar to 1 qt warm water [.2 L vinegar to 1 L  warm water]). Close the bag and soak it for 30 minutes in the solution. °5. Rinse the bag with warm water. °6. Hang the bag to dry with the pour spout open and hanging downward. °7. Store the clean bag (once it is dry) in a clean plastic bag. °8. Wash your hands well. °PREVENTING INFECTION °· Wash your hands before and after handling your catheter. °· Take showers daily and wash the area where the catheter enters your body. Do not take baths. Replace wet leg straps with dry ones, if this applies. °· Do not use powders, sprays, or lotions on the genital area. Only use creams, lotions, or ointments as directed by your caregiver. °· For females, wipe from front to back after each bowel movement. °· Drink enough fluids to keep your urine   clear or pale yellow unless you have a fluid restriction. °· Do not let the drainage bag or tubing touch or lie on the floor. °· Wear cotton underwear to absorb moisture and to keep your skin drier. °SEEK MEDICAL CARE IF:  °· Your urine is cloudy or smells unusually bad. °· Your catheter becomes clogged. °· You are not draining urine into the bag or your bladder feels full. °· Your catheter starts to leak. °SEEK IMMEDIATE MEDICAL CARE IF:  °· You have pain, swelling, redness, or pus where the catheter enters the body. °· You have pain in the abdomen, legs, lower back, or bladder. °· You have a fever. °· You see blood fill the catheter, or your urine is pink or red. °· You have nausea, vomiting, or chills. °· Your catheter gets pulled out. °MAKE SURE YOU:  °· Understand these instructions. °· Will watch your condition. °· Will get help right away if you are not doing well or get worse. °Document Released: 11/27/2005 Document Revised: 04/13/2014 Document Reviewed: 11/18/2012 °ExitCare® Patient Information ©2015 [employer]. This information is not intended to replace advice given to you by your health care provider. Make sure you discuss any questions you have with your health care  provider. ° °

## 2015-02-14 NOTE — ED Notes (Signed)
Bed: WA06 Expected date:  Expected time:  Means of arrival:  Comments: EMS ABDOMINAL PAIN /URINARY RETENTION

## 2015-02-15 LAB — URINE CULTURE
CULTURE: NO GROWTH
Colony Count: NO GROWTH

## 2015-02-17 DIAGNOSIS — R350 Frequency of micturition: Secondary | ICD-10-CM | POA: Diagnosis not present

## 2015-02-17 DIAGNOSIS — R351 Nocturia: Secondary | ICD-10-CM | POA: Diagnosis not present

## 2015-02-17 DIAGNOSIS — R3912 Poor urinary stream: Secondary | ICD-10-CM | POA: Diagnosis not present

## 2015-02-17 DIAGNOSIS — R339 Retention of urine, unspecified: Secondary | ICD-10-CM | POA: Diagnosis not present

## 2015-03-04 DIAGNOSIS — R350 Frequency of micturition: Secondary | ICD-10-CM | POA: Diagnosis not present

## 2015-03-04 DIAGNOSIS — R339 Retention of urine, unspecified: Secondary | ICD-10-CM | POA: Diagnosis not present

## 2015-03-04 DIAGNOSIS — R351 Nocturia: Secondary | ICD-10-CM | POA: Diagnosis not present

## 2015-03-04 DIAGNOSIS — R3912 Poor urinary stream: Secondary | ICD-10-CM | POA: Diagnosis not present

## 2015-04-08 DIAGNOSIS — R339 Retention of urine, unspecified: Secondary | ICD-10-CM | POA: Diagnosis not present

## 2015-04-08 DIAGNOSIS — R3912 Poor urinary stream: Secondary | ICD-10-CM | POA: Diagnosis not present

## 2015-04-08 DIAGNOSIS — R351 Nocturia: Secondary | ICD-10-CM | POA: Diagnosis not present

## 2015-06-04 ENCOUNTER — Emergency Department (HOSPITAL_COMMUNITY)
Admission: EM | Admit: 2015-06-04 | Discharge: 2015-06-04 | Disposition: A | Discharge status: Home / Self Care | Payer: Medicare Other | Attending: Emergency Medicine | Admitting: Emergency Medicine

## 2015-06-04 ENCOUNTER — Encounter (HOSPITAL_COMMUNITY): Payer: Self-pay | Admitting: Emergency Medicine

## 2015-06-04 DIAGNOSIS — Z720 Tobacco use: Secondary | ICD-10-CM | POA: Diagnosis not present

## 2015-06-04 DIAGNOSIS — Z79899 Other long term (current) drug therapy: Secondary | ICD-10-CM | POA: Diagnosis not present

## 2015-06-04 DIAGNOSIS — R338 Other retention of urine: Secondary | ICD-10-CM | POA: Diagnosis not present

## 2015-06-04 DIAGNOSIS — R339 Retention of urine, unspecified: Secondary | ICD-10-CM

## 2015-06-04 DIAGNOSIS — R6889 Other general symptoms and signs: Secondary | ICD-10-CM | POA: Diagnosis not present

## 2015-06-04 DIAGNOSIS — N401 Enlarged prostate with lower urinary tract symptoms: Secondary | ICD-10-CM | POA: Insufficient documentation

## 2015-06-04 DIAGNOSIS — Z8719 Personal history of other diseases of the digestive system: Secondary | ICD-10-CM | POA: Insufficient documentation

## 2015-06-04 DIAGNOSIS — N400 Enlarged prostate without lower urinary tract symptoms: Secondary | ICD-10-CM

## 2015-06-04 DIAGNOSIS — Z791 Long term (current) use of non-steroidal anti-inflammatories (NSAID): Secondary | ICD-10-CM | POA: Diagnosis not present

## 2015-06-04 LAB — URINALYSIS, ROUTINE W REFLEX MICROSCOPIC
Bilirubin Urine: NEGATIVE
Glucose, UA: NEGATIVE mg/dL
Ketones, ur: NEGATIVE mg/dL
LEUKOCYTES UA: NEGATIVE
Nitrite: NEGATIVE
PH: 7.5 (ref 5.0–8.0)
Protein, ur: NEGATIVE mg/dL
Specific Gravity, Urine: 1.014 (ref 1.005–1.030)
UROBILINOGEN UA: 0.2 mg/dL (ref 0.0–1.0)

## 2015-06-04 LAB — I-STAT CHEM 8, ED
BUN: 21 mg/dL — ABNORMAL HIGH (ref 6–20)
CALCIUM ION: 1.19 mmol/L (ref 1.13–1.30)
CHLORIDE: 105 mmol/L (ref 101–111)
Creatinine, Ser: 0.7 mg/dL (ref 0.61–1.24)
GLUCOSE: 106 mg/dL — AB (ref 65–99)
HEMATOCRIT: 49 % (ref 39.0–52.0)
HEMOGLOBIN: 16.7 g/dL (ref 13.0–17.0)
POTASSIUM: 3.7 mmol/L (ref 3.5–5.1)
Sodium: 138 mmol/L (ref 135–145)
TCO2: 23 mmol/L (ref 0–100)

## 2015-06-04 LAB — URINE MICROSCOPIC-ADD ON

## 2015-06-04 NOTE — ED Notes (Signed)
Bed: WA14 Expected date:  Expected time:  Means of arrival:  Comments: EMS urinary retention 

## 2015-06-04 NOTE — ED Notes (Signed)
Replaced foley bag with leg bag so pt can be discharged to go home

## 2015-06-04 NOTE — ED Notes (Signed)
Patient has had pain in pelvis area. Patient has not been able to urinate for two hours.

## 2015-06-04 NOTE — ED Provider Notes (Signed)
CSN: [id number]     Arrival date & time 06/04/15  0605 History   First MD Initiated Contact with Patient 06/04/15 [phone]     Chief Complaint  Patient presents with  . Urinary Retention    Patient has had pain in pelvis area. Patient has not been able to urinate for two hours.   Jamie Short is a 64 y.o. male with a history of BPH who presents to the ED complaining of suprapubic pain and unable to urinate since 0100 am this morning. The patient reports he last urinated around 0100 am this morning and he got up to urinate again, but was unable to urinate. He reports having suprapubic pain earlier that was relieved with foley cath placement.  The patient was seen in the emergency department on 02/14/2015 for the same urinary retention and is referred to [facility]. The patient reports he went to urologist but is unsure of the name urologist he saw. He reports he's been taking Flomax daily has not missed a dose. He reports he  Has trouble initiating stream of his hair regularly. He denies any current abdominal pain. Patient denies fevers, chills, dysuria, hematuria, rashes, abdominal pain, nausea, vomiting, testicle pain, penile pain, penile discharge, or rectal pain.   (Consider location/radiation/quality/duration/timing/severity/associated sxs/prior Treatment) The history is provided by the patient. A language interpreter was used.    Past Medical History  Diagnosis Date  . Hemorrhoids   . BPH (benign prostatic hyperplasia)    History reviewed. No pertinent past surgical history. History reviewed. No pertinent family history. History  Substance Use Topics  . Smoking status: Current Every Day Smoker -- 0.50 packs/day    Types: Cigarettes  . Smokeless tobacco: Not on file     Comment: smokes 10cigs/day  . Alcohol Use: No    Review of Systems  Constitutional: Negative for fever and chills.  HENT: Negative for congestion and sore throat.   Eyes: Negative for visual disturbance.   Respiratory: Negative for cough, shortness of breath and wheezing.   Cardiovascular: Negative for chest pain.  Gastrointestinal: Positive for abdominal pain. Negative for nausea, vomiting and diarrhea.  Genitourinary: Positive for difficulty urinating. Negative for dysuria, urgency, frequency, hematuria, flank pain, penile swelling, scrotal swelling, penile pain and testicular pain.  Musculoskeletal: Negative for back pain and neck pain.  Skin: Negative for rash.  Neurological: Negative for headaches.      Allergies  Review of patient's allergies indicates no known allergies.  Home Medications   Prior to Admission medications   Medication Sig Start Date End Date Taking? Authorizing Provider  naproxen (NAPROSYN) 500 MG tablet Take 0.5 tablets (250 mg total) by mouth 2 (two) times daily as needed (FOR PAIN). Patient taking differently: Take 250 mg by mouth daily as needed (FOR PAIN).  10/08/14  Yes Tresa Garter, MD  tamsulosin (FLOMAX) 0.4 MG CAPS capsule Take 1 capsule (0.4 mg total) by mouth daily after breakfast. 10/08/14  Yes Tresa Garter, MD  hydrocortisone (ANUSOL-HC) 25 MG suppository Place 1 suppository (25 mg total) rectally 2 (two) times daily as needed for hemorrhoids or itching. Patient not taking: Reported on 02/14/2015 02/17/14   Billy Fischer, MD  nicotine (NICODERM CQ) 21 mg/24hr patch Place 1 patch onto the skin daily. Patient not taking: Reported on 02/14/2015 09/10/13   Ripudeep K Rai, MD   BP 171/79 mmHg  Pulse 63  Temp(Src) 98.1 F (36.7 C) (Oral)  Resp 18  Ht 5' (1.524 m)  Wt 140  lb (63.504 kg)  BMI 27.34 kg/m2  SpO2 100% Physical Exam  Constitutional: He appears well-developed and well-nourished. No distress.   Nontoxic-appearing.  HENT:  Head: Normocephalic and atraumatic.  Mouth/Throat: Oropharynx is clear and moist. No oropharyngeal exudate.  Eyes: Conjunctivae are normal. Pupils are equal, round, and reactive to light. Right eye exhibits no  discharge. Left eye exhibits no discharge.  Neck: Neck supple. No JVD present. No tracheal deviation present.  Cardiovascular: Normal rate, regular rhythm, normal heart sounds and intact distal pulses.  Exam reveals no gallop and no friction rub.   No murmur heard. Pulmonary/Chest: Effort normal and breath sounds normal. No respiratory distress. He has no wheezes. He has no rales.  Abdominal: Soft. Bowel sounds are normal. He exhibits no distension and no mass. There is no tenderness. There is no rebound and no guarding.   Abdomen is soft and non-tender to palpation. Bowel sounds are present.  No suprapubic tenderness.   Genitourinary: Testes normal and penis normal. Right testis shows no swelling and no tenderness. Left testis shows no swelling and no tenderness. Circumcised. No phimosis, paraphimosis, penile erythema or penile tenderness. No discharge found.  Foley cath in place. No penile or scrotal tenderness. No penile discharge.   Musculoskeletal: He exhibits no edema.  Lymphadenopathy:    He has no cervical adenopathy.  Neurological: He is alert. Coordination normal.  Skin: Skin is warm and dry. No rash noted. He is not diaphoretic. No erythema. No pallor.  Psychiatric: He has a normal mood and affect. His behavior is normal.  Nursing note and vitals reviewed.   ED Course  Procedures (including critical care time) Labs Review Labs Reviewed  URINALYSIS, ROUTINE W REFLEX MICROSCOPIC (NOT AT [facility]) - Abnormal; Notable for the following:    Hgb urine dipstick MODERATE (*)    All other components within normal limits  I-STAT CHEM 8, ED - Abnormal; Notable for the following:    BUN 21 (*)    Glucose, Bld 106 (*)    All other components within normal limits  URINE MICROSCOPIC-ADD ON    Imaging Review No results found.   EKG Interpretation None      Filed Vitals:   06/04/15 0611  BP: 171/79  Pulse: 63  Temp: 98.1 F (36.7 C)  TempSrc: Oral  Resp: 18  Height: 5' (1.524 m)   Weight: 140 lb (63.504 kg)  SpO2: 100%     MDM   Meds given in ED:  Medications - No data to display  New Prescriptions   No medications on file    Final diagnoses:  Urinary retention  BPH (benign prostatic hyperplasia)   This  is a 64 y.o. male with a history of BPH who presents to the ED complaining of suprapubic pain and unable to urinate since 0100 am this morning. The patient reports he last urinated around 0100 am this morning and he got up to urinate again, but was unable to urinate. He reports having suprapubic pain earlier that was relieved with foley cath placement.   On my examination patient is afebrile and nontoxic-appearing. Patient's abdomen is soft and nontender to palpation. Has no penile or scrotal tenderness. Patient had relief of his pain with Foley cath. Patient has a history of BPH and is seen a urologist at [facility] previously. Patient has been taking his Flomax daily.  i-STAT Chem-8 shows a creatinine of 0.70.  With the BUN of 21.  Urinalysis is remarkable only for moderate hemoglobin he's had  previously. The urine is nitrite and leukocyte negative. Advised continue taking this medication. We'll send the patient home with Foley cath in place and have him follow-up with [facility]  At the beginning of next week. Strict return precautions provided. I advised the patient to follow-up with their primary care provider this week. I advised the patient to return to the emergency department with new or worsening symptoms or new concerns. The patient verbalized understanding and agreement with plan.    This patient was discussed with Dr. Dina Rich who agrees with assessment and plan.     Waynetta Pean, PA-C 06/04/15 [id number]  Merryl Hacker, MD 06/04/15 [phone]

## 2015-06-04 NOTE — Discharge Instructions (Signed)
Ph ??i tuy?n ti?n li?t lnh tnh  (Benign Prostatic Hypertrophy) Tuy?n ti?n li?t l m?t b? ph?n c?a h? sinh s?n ? nam gi?i. Tuy?n ti?n li?t bnh th??ng c kch th??c v hnh d?ng g?n gi?ng h?t d?. Tuy?n ti?n li?t t?o ra m?t lo?i d?ch tr?n l?n v?i tinh trng ?? t?o thnh tinh d?ch. Tuy?n ny bao quanh ni?u ??o v n?m ? m?t tr??c tr?c trng v ngay d??i bng quang. Bng quang l n?i ch?a n??c ti?u. Ni?u ??o l ?ng cho n??c ti?u t? bng quang ?i qua ?? ra kh?i c? th?. Tuy?n ti?n li?t to ra khi nam gi?i gi ?i. Tuy?n ti?n li?t ph ??i khng ph?i do b?nh ung th? gy ra ???c g?i l ph ??i tuy?n ti?n li?t lnh tnh (benign prostatic hypertrophy, BPH). Tuy?n ti?n li?t ph ??i c th? chn p vo ni?u ??o. ?i?u ny c th? lm cho kh ?i ti?u h?n. Trong giai ?o?n ??u c?a s? ph ??i, bng quang c th? ???c ti?p c?n v?i m?t ni?u ??o thu h?p b?ng cch bu?c n??c ti?u ?i qua. N?u v?n ?? tr? nn t?i t? h?n, c th? ph?i c?n ?i?u tr? n?i khoa ho?c ph?u thu?t.  Tnh tr?ng ny c?n ???c chuyn gia ch?m [place] s?c kh?e theo di. Tch t? n??c ti?u trong bng quang c th? gy nhi?m trng. p l?c ng??c dng v nhi?m trng c th? ti?n tri?n thnh t?n th??ng bng quang v suy (th?n). N?u c?n, chuyn gia ch?m [place] s?c kh?e c th? gi?i thi?u qu v? ??n m?t chuyn gia v? b?nh th?n v tuy?n ti?n li?t(bc s? chuyn khoa ti?t ni?u). NGUYN NHN  BPH l m?t v?n ?? v? s?c kh?e ph? bi?n ? nam gi?i trn 48 tu?i. Tnh tr?ng ny l m?t ph?n bnh th??ng c?a qu trnh lo ha. Tuy nhin, khng ph?i t?t c? nam gi?i ??u b? cc v?n ?? c?a tnh tr?ng ny. N?u ph ??i pht tri?n ra xa ni?u ??o, lc ?, s? khng c b?t c? chn p no ? ni?u ??o v khng c c?n tr? dng n??c ti?u. N?u ph ??i pht tri?n v? pha ni?u ??o v gy chn p, qu v? s? kh ?i ti?u.  TRI?U CH?NG   Khng th? ?i h?t n??c ti?u trong bng quang.  Th?c d?y th??ng xuyn trong ?m ?? ?i ti?u.  C?n ?i ti?u th??ng xuyn trong ngy.  Kh kh?n khi b?t ??u ?i n??c ti?u.  Gi?m kch  th??c v ?? m?nh c?a dng n??c ti?u.  Nh? gi?t n??c ti?u sau khi ?i ti?u.  ?au khi ?i ti?u (ph? bi?n h?n khi c nhi?m trng).  Khng th? ?i ti?u. Hi?n t??ng ny c?n ph?i ?i?u tr? ngay l?p t?c.  Pht tri?n nhi?m trng ???ng ti?u. CH?N ?ON  Cc xt nghi?m sau s? gip chuyn gia ch?m [place] s?c kh?e hi?u r v?n ?? c?a qu v?:  Khai thc k? ti?n s? v khm th?c th?.  Ti?n s? v? ti?t ni?u, v?i s? l?n ?i ti?u, l??ng n??c ti?u, ?? m?nh c?a dng n??c ti?u, v c?m gic h?t ho?c ??y bng quang sau khi ?i ti?u.  Ch?p qut l??ng n??c ti?u cn l?i trong bng quang ?? ?o l??ng n??c ti?u c th? v?n cn l?i trong bng quang sau khi qu v? ?i ti?u.  Ki?m tra tr?c trng b?ng ngn tay. Trong l?n ki?m tra tr?c trng, chuyn gia ch?m [address] s?c kh?e s? ki?m tra tuy?n ti?n li?t c?a qu v? b?ng cch  cho m?t ngn tay ?eo g?ng, ? bi tr?n vo tr?c trng ?? s? m?t sau c?a tuy?n ti?n li?t. Ki?m tra ny pht hi?n kch th??c c?a tuy?n v cc kh?i u ho?c s? pht tri?n b?t th??ng.  Xt nghi?m (phn tch n??c ti?u).  Sng l?c khng nguyn ??c hi?u tuy?n ti?n li?t (PSA). ?y l m?t xt nghi?m mu ???c s? d?ng ?? sng l?c ung th? tuy?n ti?n li?t.  Siu m tr?c trng. Ki?m tra ny s? d?ng sng m thanh ?? t?o hnh ?nh tuy?n ti?n li?t b?ng ph??ng php ?i?n t?. ?I?U TR?  Khi b?t ??u c tri?u ch?ng, chuyn gia ch?m [place] s?c kh?e s? theo di tnh tr?ng c?a qu v?. Trong s? nh?ng ng??i c tnh tr?ng ny, m?t ph?n ba s? c cc tri?u ch?ng ?n ??nh, m?t ph?n ba s? c cc tri?u ch?ng c?i thi?n, v m?t ph?n ba s? c cc tri?u ch?ng ti?n tri?n trong n?m ??u. Tri?u ch?ng nh? c th? khng c?n ?i?u tr?[address] ??n gi?n v th?m khm hng n?m c th? l t?t c? nh?ng g c?n ph?i lm. Thu?c v ph?u thu?t l nh?ng l?a ch?n ?i?u tr? nh?ng v?n ?? nghim tr?ng h?n. Chuyn gia ch?m [place] s?c kh?e c th? gip qu v? ??a ra m?t quy?t ??nh sng su?t v? nh?ng ?i?u t?t nh?t. Hai lo?i thu?c c s?n ?? gi?m tri?u ch?ng c?a tuy?n ti?n li?t:  Thu?c lm thu  nh? tuy?n ti?n li?t. Thu?c ny gip gi?m tri?u ch?ng. Nh?ng thu?c ny c?n c th?i gian ?? pht huy tc d?ng, v c th? m?t nhi?u thng tr??c khi th?y c?i thi?n.  Tc d?ng ph? khng ph? bi?n bao g?m cc v?n ?? v?i ch?c n?ng tnh d?c.  Thu?c ?? th? gin cc c? c?a tuy?n ti?n li?t. ?i?u ny c?ng lm gi?m t?c ngh?n thng qua gi?m chn p ln ni?u ??o. Nhm thu?c ny pht huy tc d?ng nhanh h?n nhm lm gi?m kch th??c tuy?n ti?n li?t. Thng th??ng, ng??i b?nh c th? c c?i thi?n trong nhi?u ngy ??n nhi?u tu?n.  Tc d?ng ph? c th? bao g?m chng m?t, m?t m?i, chong vng v xu?t tinh ng??c dng (gi?m kh?i l??ng xu?t tinh). C m?t vi ki?u ?i?u tr? ph?u thu?t ?? gi?m tri?u ch?ng c?a tuy?n ti?n li?t:  Ph?u thu?t c?t tuy?n ti?n li?t qua ni?u ??o (TURP) - Trong ph??ng php ?i?u tr? ny, m?t d?ng c? ???c ??a vo thng qua l? ? ??u d??ng v?t. N ???c s? d?ng ?? c?t b? cc m?nh ? li bn trong tuy?n ti?n li?t. Cc m?nh ny ???c lo?i b? thng qua cng m?t l? ? ??u d??ng v?t. Cch ny s? lo?i b? t?c ngh?n v gip lm m?t cc tri?u ch?ng.  R?ch tuy?n ti?n li?t qua ni?u ??o (TUIP) - Trong th? thu?t ny, r?ch cc cc v?t nh? trong tuy?n ti?n li?t. Th? thu?t ny lm gi?m p l?c c?a tuy?n ti?n li?t ln ni?u ??o.  Li?u php nhi?t b?ng vi sng qua ni?u ??o (TUMT)--Th? thu?t ny s? d?ng vi sng ?? t?o ra nhi?t. Nhi?t s? ph h?y v lo?i b? m?t l??ng nh? m tuy?n ti?n li?t.  Tiu mn b?ng kim qua ni?u ??o (TUNA)-?y l th? thu?t s? d?ng t?n s? v tuy?n ?i?n ?? th?c hi?n t??ng t? nh? TUMT.  Lm ?ng m k? b?ng tia laser (ILC)-?y l th? thu?t s? d?ng laser ?? th?c hi?n t??ng t? nh? TUMT v TUNA.  ?i?n b?c bay tuy?n  ti?n li?t qua ni?u ??o (TUVP)-?y l th? thu?t s? d?ng cc ?i?n c?c ?? th?c hi?n t??ng t? nh? cc th? thu?t ???c li?t k ? trn. ?I KHM N?U:   Qu v? b? s?t.  ?au l?ng khng r nguyn nhn.  Cc tri?u ch?ng khng gi?m sau khi s? d?ng thu?c ? k ??n.  Qu v? b? nh?ng ph?n ?ng ph? do thu?c ?ang  dng.  N??c ti?u c?a qu v? tr? nn r?t s?m mu ho?c c mi hi.  Vng b?ng d??i c?a qu v? ch??ng ln v ?i ti?u kh kh?n. NGAY L?P T?C ?I KHM N?U:   Qu v? ??t nhin khng th? ?i ti?u. ?y l tr??ng h?p kh?n c?p. Qu v? c?n ?i khm ngay l?p t?c.  C m?t l??ng l?n mu ho?c c?c mu ?ng trong n??c ti?u.  V?n ?? ?i ti?u c?a qu v? khng th? ki?m sot ???c.  Qu v? b? chong vng, chng m?t n?ng ho?c qu v? c?m th?y mu?n ng?t.  Qu v? b? ?au th?t l?ng ho?c ?au m?ng s??n t? m?c ?? trung bnh ??n m?c ?? n?ng.  qu v? b? ?n l?nh ho?c s?t. Document Released: 11/27/2005 Document Revised: 12/02/2013 [facility] Patient Information 2015 [place]. This information is not intended to replace advice given to you by your health care provider. Make sure you discuss any questions you have with your health care provider. Acute Urinary Retention Acute urinary retention is the temporary inability to urinate. This is a common problem in older men. As men age their prostates become larger and block the flow of urine from the bladder. This is usually a problem that has come on gradually.  HOME CARE INSTRUCTIONS If you are sent home with a Foley catheter and a drainage system, you will need to discuss the best course of action with your health care provider. While the catheter is in, maintain a good intake of fluids. Keep the drainage bag emptied and lower than your catheter. This is so that contaminated urine will not flow back into your bladder, which could lead to a urinary tract infection. There are two main types of drainage bags. One is a large bag that usually is used at night. It has a good capacity that will allow you to sleep through the night without having to empty it. The second type is called a leg bag. It has a smaller capacity, so it needs to be emptied more frequently. However, the main advantage is that it can be attached by a leg strap and can go underneath your clothing, allowing you the  freedom to move about or leave your home. Only take over-the-counter or prescription medicines for pain, discomfort, or fever as directed by your health care provider.  SEEK MEDICAL CARE IF:  You develop a low-grade fever.  You experience spasms or leakage of urine with the spasms. SEEK IMMEDIATE MEDICAL CARE IF:   You develop chills or fever.  Your catheter stops draining urine.  Your catheter falls out.  You start to develop increased bleeding that does not respond to rest and increased fluid intake. MAKE SURE YOU:  Understand these instructions.  Will watch your condition.  Will get help right away if you are not doing well or get worse. Document Released: 03/05/2001 Document Revised: 12/02/2013 Document Reviewed: 05/08/2013 [facility] Patient Information 2015 [place], [place]. This information is not intended to replace advice given to you by your health care provider. Make sure you discuss any questions you have with your  health care provider. ° °

## 2015-06-08 DIAGNOSIS — R339 Retention of urine, unspecified: Secondary | ICD-10-CM | POA: Diagnosis not present

## 2015-06-18 DIAGNOSIS — R3912 Poor urinary stream: Secondary | ICD-10-CM | POA: Diagnosis not present

## 2015-06-18 DIAGNOSIS — R350 Frequency of micturition: Secondary | ICD-10-CM | POA: Diagnosis not present

## 2015-07-01 DIAGNOSIS — R339 Retention of urine, unspecified: Secondary | ICD-10-CM | POA: Diagnosis not present

## 2015-07-01 DIAGNOSIS — R351 Nocturia: Secondary | ICD-10-CM | POA: Diagnosis not present

## 2015-07-01 DIAGNOSIS — R3912 Poor urinary stream: Secondary | ICD-10-CM | POA: Diagnosis not present

## 2015-07-01 DIAGNOSIS — R350 Frequency of micturition: Secondary | ICD-10-CM | POA: Diagnosis not present

## 2015-07-12 DIAGNOSIS — R350 Frequency of micturition: Secondary | ICD-10-CM | POA: Diagnosis not present

## 2015-07-12 DIAGNOSIS — R3912 Poor urinary stream: Secondary | ICD-10-CM | POA: Diagnosis not present

## 2015-07-12 DIAGNOSIS — R351 Nocturia: Secondary | ICD-10-CM | POA: Diagnosis not present

## 2015-08-11 DIAGNOSIS — Z23 Encounter for immunization: Secondary | ICD-10-CM | POA: Diagnosis not present

## 2015-08-11 DIAGNOSIS — M5430 Sciatica, unspecified side: Secondary | ICD-10-CM | POA: Diagnosis not present

## 2015-08-23 DIAGNOSIS — R339 Retention of urine, unspecified: Secondary | ICD-10-CM | POA: Diagnosis not present

## 2015-08-23 DIAGNOSIS — R351 Nocturia: Secondary | ICD-10-CM | POA: Diagnosis not present

## 2015-08-23 DIAGNOSIS — R3912 Poor urinary stream: Secondary | ICD-10-CM | POA: Diagnosis not present

## 2015-08-23 DIAGNOSIS — R350 Frequency of micturition: Secondary | ICD-10-CM | POA: Diagnosis not present

## 2015-09-01 DIAGNOSIS — M5430 Sciatica, unspecified side: Secondary | ICD-10-CM | POA: Diagnosis not present

## 2015-09-02 DIAGNOSIS — M4326 Fusion of spine, lumbar region: Secondary | ICD-10-CM | POA: Diagnosis not present

## 2015-09-02 DIAGNOSIS — M4726 Other spondylosis with radiculopathy, lumbar region: Secondary | ICD-10-CM | POA: Diagnosis not present

## 2015-09-02 DIAGNOSIS — M5116 Intervertebral disc disorders with radiculopathy, lumbar region: Secondary | ICD-10-CM | POA: Diagnosis not present

## 2015-09-02 DIAGNOSIS — M5432 Sciatica, left side: Secondary | ICD-10-CM | POA: Diagnosis not present

## 2015-09-08 DIAGNOSIS — Z716 Tobacco abuse counseling: Secondary | ICD-10-CM | POA: Diagnosis not present

## 2015-09-08 DIAGNOSIS — F17210 Nicotine dependence, cigarettes, uncomplicated: Secondary | ICD-10-CM | POA: Diagnosis not present

## 2015-09-08 DIAGNOSIS — M5416 Radiculopathy, lumbar region: Secondary | ICD-10-CM | POA: Diagnosis not present

## 2015-09-11 ENCOUNTER — Encounter (HOSPITAL_COMMUNITY): Payer: Self-pay

## 2015-09-11 ENCOUNTER — Emergency Department (HOSPITAL_COMMUNITY)
Admission: EM | Admit: 2015-09-11 | Discharge: 2015-09-11 | Disposition: A | Discharge status: Home / Self Care | Payer: Medicare Other | Attending: Emergency Medicine | Admitting: Emergency Medicine

## 2015-09-11 DIAGNOSIS — R339 Retention of urine, unspecified: Secondary | ICD-10-CM | POA: Insufficient documentation

## 2015-09-11 DIAGNOSIS — N400 Enlarged prostate without lower urinary tract symptoms: Secondary | ICD-10-CM | POA: Diagnosis not present

## 2015-09-11 DIAGNOSIS — R39198 Other difficulties with micturition: Secondary | ICD-10-CM | POA: Diagnosis not present

## 2015-09-11 DIAGNOSIS — Z79899 Other long term (current) drug therapy: Secondary | ICD-10-CM | POA: Diagnosis not present

## 2015-09-11 DIAGNOSIS — R109 Unspecified abdominal pain: Secondary | ICD-10-CM | POA: Diagnosis not present

## 2015-09-11 DIAGNOSIS — Z720 Tobacco use: Secondary | ICD-10-CM | POA: Insufficient documentation

## 2015-09-11 LAB — URINALYSIS, ROUTINE W REFLEX MICROSCOPIC
Bilirubin Urine: NEGATIVE
GLUCOSE, UA: NEGATIVE mg/dL
HGB URINE DIPSTICK: NEGATIVE
Ketones, ur: NEGATIVE mg/dL
Leukocytes, UA: NEGATIVE
Nitrite: NEGATIVE
Protein, ur: NEGATIVE mg/dL
SPECIFIC GRAVITY, URINE: 1.015 (ref 1.005–1.030)
UROBILINOGEN UA: 0.2 mg/dL (ref 0.0–1.0)
pH: 7 (ref 5.0–8.0)

## 2015-09-11 NOTE — ED Notes (Signed)
Leg bag applied and pt given instructions on f/c care. Verbalized understanding.

## 2015-09-11 NOTE — ED Provider Notes (Signed)
CSN: [id number]     Arrival date & time 09/11/15  0840 History   First MD Initiated Contact with Patient 09/11/15 0845     Chief Complaint  Patient presents with  . Urinary Retention     (Consider location/radiation/quality/duration/timing/severity/associated sxs/prior Treatment) The history is provided by a relative and the patient. A language interpreter was used (family member).     Pt with hx BPH and urinary retention p/w urinary rentention since 3am.  Associated lower abdominal discomfort and feeling of fullness.  He has been taking his Rapaflo as directed.  Usually urinates 3-4 times at night and last night only urinated once.  Denies any symptoms prior to the started of the retention- specifically denies fevers, dysuria.  Has had constipation, last BM was today and was small.  Urologist is Dr Matilde Sprang.  Appt in Nov to discuss possibility of surgery.    Past Medical History  Diagnosis Date  . Hemorrhoids   . BPH (benign prostatic hyperplasia)    No past surgical history on file. No family history on file. Social History  Substance Use Topics  . Smoking status: Current Every Day Smoker -- 0.50 packs/day    Types: Cigarettes  . Smokeless tobacco: None     Comment: smokes 10cigs/day  . Alcohol Use: No    Review of Systems  Constitutional: Negative for fever.  Gastrointestinal: Positive for abdominal pain.  Genitourinary: Positive for difficulty urinating.  Allergic/Immunologic: Negative for immunocompromised state.  Hematological: Does not bruise/bleed easily.  Psychiatric/Behavioral: Negative for self-injury.      Allergies  Review of patient's allergies indicates no known allergies.  Home Medications   Prior to Admission medications   Medication Sig Start Date End Date Taking? Authorizing Provider  hydrocortisone (ANUSOL-HC) 25 MG suppository Place 1 suppository (25 mg total) rectally 2 (two) times daily as needed for hemorrhoids or itching. Patient not  taking: Reported on 02/14/2015 02/17/14   Billy Fischer, MD  naproxen (NAPROSYN) 500 MG tablet Take 0.5 tablets (250 mg total) by mouth 2 (two) times daily as needed (FOR PAIN). Patient taking differently: Take 250 mg by mouth daily as needed (FOR PAIN).  10/08/14   Tresa Garter, MD  nicotine (NICODERM CQ) 21 mg/24hr patch Place 1 patch onto the skin daily. Patient not taking: Reported on 02/14/2015 09/10/13   Ripudeep Krystal Eaton, MD  tamsulosin (FLOMAX) 0.4 MG CAPS capsule Take 1 capsule (0.4 mg total) by mouth daily after breakfast. 10/08/14   Tresa Garter, MD   BP 145/84 mmHg  Pulse 74  Temp(Src) 98 F (36.7 C) (Oral)  Resp 13  SpO2 98% Physical Exam  Constitutional: He appears well-developed and well-nourished. No distress.  HENT:  Head: Normocephalic and atraumatic.  Neck: Neck supple.  Cardiovascular: Normal rate and regular rhythm.   Pulmonary/Chest: Effort normal and breath sounds normal. No respiratory distress. He has no wheezes. He has no rales.  Abdominal: Soft. There is no rebound and no guarding.  Fullness and tenderness of lower abdomen.   Genitourinary: Prostate is enlarged. Prostate is not tender.  No stool impaction.   Neurological: He is alert. He exhibits normal muscle tone.  Skin: He is not diaphoretic.  Nursing note and vitals reviewed.   ED Course  Procedures (including critical care time) Labs Review Labs Reviewed  URINALYSIS, ROUTINE W REFLEX MICROSCOPIC (NOT AT [facility])    Imaging Review No results found. I have personally reviewed and evaluated these images and lab results as part of my medical  decision-making.   EKG Interpretation None       9:33 AM Repeat abdominal exam is benign.  Soft, nondistended, nontender.  Foley has drained between 450-500cc.  Pt feels it is draining slower than the last time he was in the ED.   9:46 AM Discussed pt with Dr Colin Rhein who will also see the patient.    MDM   Final diagnoses:  Urinary retention     Afebrile, nontoxic patient with BPH and hx urinary retention p/w recurrent urinary retention since 3am, approximately 6 hours.  Pt c/o abdominal discomfort that subsided with placement of foley.  Bladder scan proved foley to be draining well, as did 665ml output recorded.  Repeat abdominal exam benign, pt feeling relief.  No systemic symptoms.  UA does not appear infected.  Foley left in place, d/c with urology follow up.   Discussed result, findings, treatment, and follow up  with patient.  Pt given return precautions.  Pt verbalizes understanding and agrees with plan.         Clayton Bibles, PA-C 09/11/15 [place], MD 09/14/15 [phone]

## 2015-09-11 NOTE — Discharge Instructions (Signed)
Read the information below.  You may return to the Emergency Department at any time for worsening condition or any new symptoms that concern you.  If you develop high fevers, worsening abdominal pain, uncontrolled vomiting, or are unable to tolerate fluids by mouth, return to the ER for a recheck.  Please call your urologist Monday morning to set up a close follow up appointment.      ??c cc thng tin d??i ?y. B?n c th? quay tr? l?i Khoa C?p c?u b?t c? lc no cho x?u ?i tnh tr?ng ho?c b?t k? tri?u ch?ng m?i m b?n quan tm. N?u b?n pht tri?n s?t cao, x?u ?i ?au b?ng, nn m?a khng ki?m sot ???c, ho?c khng th? ch?u ??ng ???c ch?t l?ng b?ng mi?ng, tr? v? ER cho ki?m. Hy g?i bc s? ti?t ni?u c?a b?n vo sng th? hai ?? thi?t l?p m?t g?n theo di cu?c h?n.    Acute Urinary Retention Acute urinary retention is the temporary inability to urinate. This is a common problem in older men. As men age their prostates become larger and block the flow of urine from the bladder. This is usually a problem that has come on gradually.  HOME CARE INSTRUCTIONS If you are sent home with a Foley catheter and a drainage system, you will need to discuss the best course of action with your health care provider. While the catheter is in, maintain a good intake of fluids. Keep the drainage bag emptied and lower than your catheter. This is so that contaminated urine will not flow back into your bladder, which could lead to a urinary tract infection. There are two main types of drainage bags. One is a large bag that usually is used at night. It has a good capacity that will allow you to sleep through the night without having to empty it. The second type is called a leg bag. It has a smaller capacity, so it needs to be emptied more frequently. However, the main advantage is that it can be attached by a leg strap and can go underneath your clothing, allowing you the freedom to move about or leave your home. Only take  over-the-counter or prescription medicines for pain, discomfort, or fever as directed by your health care provider.  SEEK MEDICAL CARE IF:  You develop a low-grade fever.  You experience spasms or leakage of urine with the spasms. SEEK IMMEDIATE MEDICAL CARE IF:   You develop chills or fever.  Your catheter stops draining urine.  Your catheter falls out.  You start to develop increased bleeding that does not respond to rest and increased fluid intake. MAKE SURE YOU:  Understand these instructions.  Will watch your condition.  Will get help right away if you are not doing well or get worse. Document Released: 03/05/2001 Document Revised: 12/02/2013 Document Reviewed: 05/08/2013 [facility] Patient Information 2015 [place], [place]. This information is not intended to replace advice given to you by your health care provider. Make sure you discuss any questions you have with your health care provider.

## 2015-09-11 NOTE — ED Notes (Addendum)
Gwyndolyn Saxon ED tech inserted 16Fr F/C with assistance Forensic psychologist for urinary retention. Pt tolerated well. No order for bladder scanner at time of insertion. Output of 644ml yellow urine.

## 2015-09-11 NOTE — ED Notes (Signed)
Awake. Verbally responsive. A/O x4. Resp even and unlabored. No audible adventitious breath sounds noted. ABC's intact.  

## 2015-09-11 NOTE — ED Notes (Signed)
He c/o unable to urinate since 0300 today.  He states he has done this before and sees [facility]).  He states in the past he has been successfully treated "with medicine"; and has impending procedure with them.

## 2015-09-15 DIAGNOSIS — R339 Retention of urine, unspecified: Secondary | ICD-10-CM | POA: Diagnosis not present

## 2015-09-17 DIAGNOSIS — R3912 Poor urinary stream: Secondary | ICD-10-CM | POA: Diagnosis not present

## 2015-09-18 DIAGNOSIS — M545 Low back pain: Secondary | ICD-10-CM | POA: Diagnosis not present

## 2015-09-20 DIAGNOSIS — M4806 Spinal stenosis, lumbar region: Secondary | ICD-10-CM | POA: Diagnosis not present

## 2015-10-07 DIAGNOSIS — R350 Frequency of micturition: Secondary | ICD-10-CM | POA: Diagnosis not present

## 2015-10-07 DIAGNOSIS — R351 Nocturia: Secondary | ICD-10-CM | POA: Diagnosis not present

## 2015-10-08 ENCOUNTER — Other Ambulatory Visit: Payer: Self-pay | Admitting: Urology

## 2015-10-12 ENCOUNTER — Encounter (HOSPITAL_BASED_OUTPATIENT_CLINIC_OR_DEPARTMENT_OTHER): Payer: Self-pay | Admitting: *Deleted

## 2015-10-12 DIAGNOSIS — M5416 Radiculopathy, lumbar region: Secondary | ICD-10-CM | POA: Diagnosis not present

## 2015-10-12 NOTE — Progress Notes (Signed)
SPOKE W/ SON, PT SPEAKS NO ENGLISH, WHOM WILL BE INTERPRETOR DOS.  NPO AFTER MN WITH EXCEPTION WATER/ GATORADE UNTIL 0800.  ARRIVE AT 1740.  NEEDS HG.

## 2015-10-18 ENCOUNTER — Encounter (HOSPITAL_COMMUNITY)
Admission: RE | Disposition: A | Discharge status: Home / Self Care | Payer: Self-pay | Source: Ambulatory Visit | Attending: Urology

## 2015-10-18 ENCOUNTER — Observation Stay (HOSPITAL_BASED_OUTPATIENT_CLINIC_OR_DEPARTMENT_OTHER)
Admission: RE | Admit: 2015-10-18 | Discharge: 2015-10-20 | Disposition: A | Discharge status: Home / Self Care | Payer: Medicare Other | Source: Ambulatory Visit | Attending: Urology | Admitting: Urology

## 2015-10-18 ENCOUNTER — Encounter (HOSPITAL_BASED_OUTPATIENT_CLINIC_OR_DEPARTMENT_OTHER): Payer: Self-pay | Admitting: Anesthesiology

## 2015-10-18 ENCOUNTER — Ambulatory Visit (HOSPITAL_BASED_OUTPATIENT_CLINIC_OR_DEPARTMENT_OTHER): Payer: Medicare Other | Admitting: Anesthesiology

## 2015-10-18 DIAGNOSIS — R3915 Urgency of urination: Secondary | ICD-10-CM | POA: Diagnosis not present

## 2015-10-18 DIAGNOSIS — R300 Dysuria: Secondary | ICD-10-CM | POA: Insufficient documentation

## 2015-10-18 DIAGNOSIS — D494 Neoplasm of unspecified behavior of bladder: Secondary | ICD-10-CM | POA: Diagnosis not present

## 2015-10-18 DIAGNOSIS — N3000 Acute cystitis without hematuria: Secondary | ICD-10-CM | POA: Insufficient documentation

## 2015-10-18 DIAGNOSIS — N3080 Other cystitis without hematuria: Secondary | ICD-10-CM | POA: Insufficient documentation

## 2015-10-18 DIAGNOSIS — N401 Enlarged prostate with lower urinary tract symptoms: Secondary | ICD-10-CM | POA: Diagnosis not present

## 2015-10-18 DIAGNOSIS — R350 Frequency of micturition: Secondary | ICD-10-CM | POA: Diagnosis not present

## 2015-10-18 DIAGNOSIS — N400 Enlarged prostate without lower urinary tract symptoms: Secondary | ICD-10-CM | POA: Diagnosis not present

## 2015-10-18 DIAGNOSIS — F17210 Nicotine dependence, cigarettes, uncomplicated: Secondary | ICD-10-CM | POA: Insufficient documentation

## 2015-10-18 DIAGNOSIS — R339 Retention of urine, unspecified: Secondary | ICD-10-CM | POA: Insufficient documentation

## 2015-10-18 DIAGNOSIS — R338 Other retention of urine: Secondary | ICD-10-CM | POA: Diagnosis not present

## 2015-10-18 HISTORY — DX: Presence of urogenital implants: Z96.0

## 2015-10-18 HISTORY — DX: Retention of urine, unspecified: R33.9

## 2015-10-18 HISTORY — DX: Personal history of other diseases of the digestive system: Z87.19

## 2015-10-18 HISTORY — DX: Presence of spectacles and contact lenses: Z97.3

## 2015-10-18 HISTORY — DX: Presence of other specified devices: Z97.8

## 2015-10-18 HISTORY — PX: TRANSURETHRAL RESECTION OF PROSTATE: SHX73

## 2015-10-18 HISTORY — DX: Unspecified osteoarthritis, unspecified site: M19.90

## 2015-10-18 HISTORY — DX: Presence of dental prosthetic device (complete) (partial): Z97.2

## 2015-10-18 LAB — CBC
HCT: 46.5 % (ref 39.0–52.0)
Hemoglobin: 15 g/dL (ref 13.0–17.0)
MCH: 31 pg (ref 26.0–34.0)
MCHC: 32.3 g/dL (ref 30.0–36.0)
MCV: 96.1 fL (ref 78.0–100.0)
PLATELETS: 300 10*3/uL (ref 150–400)
RBC: 4.84 MIL/uL (ref 4.22–5.81)
RDW: 13.9 % (ref 11.5–15.5)
WBC: 24.4 10*3/uL — ABNORMAL HIGH (ref 4.0–10.5)

## 2015-10-18 LAB — BASIC METABOLIC PANEL
Anion gap: 8 (ref 5–15)
BUN: 17 mg/dL (ref 6–20)
CHLORIDE: 104 mmol/L (ref 101–111)
CO2: 27 mmol/L (ref 22–32)
CREATININE: 0.94 mg/dL (ref 0.61–1.24)
Calcium: 8.8 mg/dL — ABNORMAL LOW (ref 8.9–10.3)
GFR calc Af Amer: >60 mL/min (ref >60)
GLUCOSE: 127 mg/dL — AB (ref 65–99)
Potassium: 3.8 mmol/L (ref 3.5–5.1)
SODIUM: 139 mmol/L (ref 135–145)

## 2015-10-18 SURGERY — TRANSURETHRAL RESECTION OF THE PROSTATE WITH GYRUS INSTRUMENTS
Anesthesia: General

## 2015-10-18 MED ORDER — EPHEDRINE SULFATE 50 MG/ML IJ SOLN
INTRAMUSCULAR | Status: DC | PRN
Start: 2015-10-18 — End: 2015-10-18
  Administered 2015-10-18: 10 mg via INTRAVENOUS
  Administered 2015-10-18 (×2): 15 mg via INTRAVENOUS
  Administered 2015-10-18: 10 mg via INTRAVENOUS

## 2015-10-18 MED ORDER — HYDROMORPHONE HCL 1 MG/ML IJ SOLN
0.5000 mg | INTRAMUSCULAR | Status: DC | PRN
  Administered 2015-10-18 – 2015-10-20 (×2): 0.5 mg via INTRAVENOUS
  Filled 2015-10-18 (×2): qty 1

## 2015-10-18 MED ORDER — BELLADONNA ALKALOIDS-OPIUM 16.2-60 MG RE SUPP: 1.0000 | Freq: Four times a day (QID) | RECTAL | Status: DC | PRN

## 2015-10-18 MED ORDER — DIPHENHYDRAMINE HCL 50 MG/ML IJ SOLN: 12.5000 mg | Freq: Four times a day (QID) | INTRAMUSCULAR | Status: DC | PRN

## 2015-10-18 MED ORDER — HYDROMORPHONE HCL 1 MG/ML IJ SOLN
INTRAMUSCULAR | Status: AC
  Filled 2015-10-18: qty 1

## 2015-10-18 MED ORDER — ACETAMINOPHEN 325 MG PO TABS: 650.0000 mg | ORAL_TABLET | ORAL | Status: DC | PRN

## 2015-10-18 MED ORDER — HYDROMORPHONE HCL 1 MG/ML IJ SOLN
0.2500 mg | INTRAMUSCULAR | Status: DC | PRN
  Administered 2015-10-18 (×2): 0.25 mg via INTRAVENOUS
  Filled 2015-10-18: qty 1

## 2015-10-18 MED ORDER — PIPERACILLIN-TAZOBACTAM 3.375 G IVPB
3.3750 g | Freq: Three times a day (TID) | INTRAVENOUS | Status: AC
  Administered 2015-10-18 – 2015-10-19 (×3): 3.375 g via INTRAVENOUS
  Filled 2015-10-18 (×2): qty 50

## 2015-10-18 MED ORDER — PROPOFOL 10 MG/ML IV BOLUS
INTRAVENOUS | Status: DC | PRN
  Administered 2015-10-18: 30 mg via INTRAVENOUS
  Administered 2015-10-18: 50 mg via INTRAVENOUS
  Administered 2015-10-18: 20 mg via INTRAVENOUS
  Administered 2015-10-18: 150 mg via INTRAVENOUS

## 2015-10-18 MED ORDER — ONDANSETRON HCL 4 MG/2ML IJ SOLN
INTRAMUSCULAR | Status: DC | PRN
  Administered 2015-10-18: 4 mg via INTRAVENOUS

## 2015-10-18 MED ORDER — SODIUM CHLORIDE 0.9 % IR SOLN
Status: DC | PRN
  Administered 2015-10-18: 21000 mL via INTRAVESICAL

## 2015-10-18 MED ORDER — ONDANSETRON HCL 4 MG/2ML IJ SOLN
4.0000 mg | Freq: Once | INTRAMUSCULAR | Status: DC | PRN
  Filled 2015-10-18: qty 2

## 2015-10-18 MED ORDER — METOCLOPRAMIDE HCL 5 MG/ML IJ SOLN
INTRAMUSCULAR | Status: DC | PRN
  Administered 2015-10-18: 10 mg via INTRAVENOUS

## 2015-10-18 MED ORDER — MEPERIDINE HCL 25 MG/ML IJ SOLN
6.2500 mg | INTRAMUSCULAR | Status: DC | PRN
  Filled 2015-10-18: qty 1

## 2015-10-18 MED ORDER — FENTANYL CITRATE (PF) 100 MCG/2ML IJ SOLN
INTRAMUSCULAR | Status: AC
  Filled 2015-10-18: qty 4

## 2015-10-18 MED ORDER — PIPERACILLIN-TAZOBACTAM 3.375 G IVPB 30 MIN
3.3750 g | INTRAVENOUS | Status: AC
  Administered 2015-10-18: 3.375 g via INTRAVENOUS
  Filled 2015-10-18 (×2): qty 50

## 2015-10-18 MED ORDER — LACTATED RINGERS IV SOLN
INTRAVENOUS | Status: DC
  Administered 2015-10-18 (×3): via INTRAVENOUS
  Filled 2015-10-18: qty 1000

## 2015-10-18 MED ORDER — SUCCINYLCHOLINE CHLORIDE 20 MG/ML IJ SOLN
INTRAMUSCULAR | Status: DC | PRN
  Administered 2015-10-18: 100 mg via INTRAVENOUS

## 2015-10-18 MED ORDER — PHENYLEPHRINE HCL 10 MG/ML IJ SOLN
INTRAMUSCULAR | Status: DC | PRN
  Administered 2015-10-18: 80 ug via INTRAVENOUS
  Administered 2015-10-18: 40 ug via INTRAVENOUS
  Administered 2015-10-18: 80 ug via INTRAVENOUS
  Administered 2015-10-18: 40 ug via INTRAVENOUS

## 2015-10-18 MED ORDER — DIPHENHYDRAMINE HCL 12.5 MG/5ML PO ELIX: 12.5000 mg | ORAL_SOLUTION | Freq: Four times a day (QID) | ORAL | Status: DC | PRN

## 2015-10-18 MED ORDER — SODIUM CHLORIDE 0.9 % IV SOLN
INTRAVENOUS | Status: DC
  Administered 2015-10-18 – 2015-10-20 (×5): via INTRAVENOUS

## 2015-10-18 MED ORDER — MIDAZOLAM HCL 5 MG/5ML IJ SOLN
INTRAMUSCULAR | Status: DC | PRN
  Administered 2015-10-18: 1 mg via INTRAVENOUS

## 2015-10-18 MED ORDER — MIDAZOLAM HCL 2 MG/2ML IJ SOLN
INTRAMUSCULAR | Status: AC
  Filled 2015-10-18: qty 2

## 2015-10-18 MED ORDER — ONDANSETRON HCL 4 MG/2ML IJ SOLN: 4.0000 mg | INTRAMUSCULAR | Status: DC | PRN

## 2015-10-18 MED ORDER — OXYCODONE-ACETAMINOPHEN 5-325 MG PO TABS
1.0000 | ORAL_TABLET | ORAL | Status: DC | PRN
  Administered 2015-10-18: 1 via ORAL
  Administered 2015-10-19 (×2): 2 via ORAL
  Administered 2015-10-19 – 2015-10-20 (×3): 1 via ORAL
  Filled 2015-10-18 (×2): qty 2
  Filled 2015-10-18 (×4): qty 1

## 2015-10-18 MED ORDER — DEXAMETHASONE SODIUM PHOSPHATE 4 MG/ML IJ SOLN
INTRAMUSCULAR | Status: DC | PRN
  Administered 2015-10-18: 10 mg via INTRAVENOUS

## 2015-10-18 MED ORDER — LIDOCAINE HCL (CARDIAC) 20 MG/ML IV SOLN
INTRAVENOUS | Status: DC | PRN
  Administered 2015-10-18: 60 mg via INTRAVENOUS

## 2015-10-18 MED ORDER — FENTANYL CITRATE (PF) 100 MCG/2ML IJ SOLN
INTRAMUSCULAR | Status: DC | PRN
Start: 2015-10-18 — End: 2015-10-18
  Administered 2015-10-18 (×2): 50 ug via INTRAVENOUS
  Administered 2015-10-18 (×2): 25 ug via INTRAVENOUS

## 2015-10-18 MED ORDER — ZOLPIDEM TARTRATE 5 MG PO TABS: 5.0000 mg | ORAL_TABLET | Freq: Every evening | ORAL | Status: DC | PRN

## 2015-10-18 SURGICAL SUPPLY — 29 items
BAG DRN ANRFLXCHMBR STRAP LEK (BAG)
BAG URINE DRAINAGE (UROLOGICAL SUPPLIES) ×1 IMPLANT
BAG URINE LEG 19OZ MD ST LTX (BAG) IMPLANT
BAG URINE LEG 500ML (DRAIN) IMPLANT
BAG URO CATCHER STRL LF (DRAPE) ×2 IMPLANT
CATH FOLEY 2WAY SLVR  5CC 22FR (CATHETERS)
CATH FOLEY 2WAY SLVR 30CC 20FR (CATHETERS) IMPLANT
CATH FOLEY 2WAY SLVR 5CC 22FR (CATHETERS) IMPLANT
CATH FOLEY 3WAY 30CC 22F (CATHETERS) ×1 IMPLANT
CLOTH BEACON ORANGE TIMEOUT ST (SAFETY) ×2 IMPLANT
ELECT REM PT RETURN 9FT ADLT (ELECTROSURGICAL) ×2
ELECTRODE REM PT RTRN 9FT ADLT (ELECTROSURGICAL) ×1 IMPLANT
EVACUATOR MICROVAS BLADDER (UROLOGICAL SUPPLIES) IMPLANT
GLOVE BIO SURGEON STRL SZ8 (GLOVE) ×2 IMPLANT
GLOVE BIOGEL PI IND STRL 7.5 (GLOVE) IMPLANT
GLOVE BIOGEL PI INDICATOR 7.5 (GLOVE) ×1
GLOVE SURG SS PI 7.5 STRL IVOR (GLOVE) ×3 IMPLANT
GOWN STRL REUS W/ TWL LRG LVL3 (GOWN DISPOSABLE) ×1 IMPLANT
GOWN STRL REUS W/TWL LRG LVL3 (GOWN DISPOSABLE) ×2
HOLDER FOLEY CATH W/STRAP (MISCELLANEOUS) ×1 IMPLANT
IV NS IRRIG 3000ML ARTHROMATIC (IV SOLUTION) ×8 IMPLANT
KIT ROOM TURNOVER WOR (KITS) ×2 IMPLANT
LOOP CUT BIPOLAR 24F LRG (ELECTROSURGICAL) ×1 IMPLANT
MANIFOLD NEPTUNE II (INSTRUMENTS) ×1 IMPLANT
PACK CYSTO (CUSTOM PROCEDURE TRAY) ×3 IMPLANT
SET ASPIRATION TUBING (TUBING) IMPLANT
SYR 30ML LL (SYRINGE) ×1 IMPLANT
SYRINGE IRR TOOMEY STRL 70CC (SYRINGE) ×1 IMPLANT
TUBE CONNECTING 12X1/4 (SUCTIONS) ×1 IMPLANT

## 2015-10-18 NOTE — Anesthesia Postprocedure Evaluation (Signed)
  Anesthesia Post-op Note  Patient: Jamie Short  Procedure(s) Performed: Procedure(s): TRANSURETHRAL RESECTION OF THE PROSTATE WITH GYRUS INSTRUMENTS WITH RESECTION OF BLADDER WALL TUMOR (N/A)  Patient Location: PACU  Anesthesia Type:General  Level of Consciousness: awake, alert , oriented and patient cooperative  Airway and Oxygen Therapy: Patient Spontanous Breathing and Patient connected to nasal cannula oxygen  Post-op Pain: none  Post-op Assessment: Post-op Vital signs reviewed, Patient's Cardiovascular Status Stable, Respiratory Function Stable, Patent Airway, No signs of Nausea or vomiting and Pain level controlled              Post-op Vital Signs: Reviewed and stable  Last Vitals:  Filed Vitals:   10/18/15 1730  BP: 110/59  Pulse: 84  Temp:   Resp: 18    Complications: unexpected post-op hospitalization and respiratory complications, pt remains with normal, unlabored respiration.  Discussed aspiration with son and patient.  They understand need for monitoring overnight for signs of pneumonia, given intra-op aspiration.  Stable and comfortable presently.

## 2015-10-18 NOTE — Transfer of Care (Addendum)
  Last Vitals:  Filed Vitals:   10/18/15 1310  BP: 110/54  Pulse: 69  Temp: 36.4 C  Resp: 16   Immediate Anesthesia Transfer of Care Note  Patient: Jamie Short  Procedure(s) Performed: Procedure(s) (LRB): TRANSURETHRAL RESECTION OF THE PROSTATE WITH GYRUS INSTRUMENTS WITH RESECTION OF BLADDER WALL TUMOR (N/A)  Patient Location: PACU  Anesthesia Type: General  Level of Consciousness: awake, alert  and oriented  Airway & Oxygen Therapy: Patient Spontanous Breathing and Patient connected to face mask oxygen  Post-op Assessment: Report given to PACU RN and Post -op Vital signs reviewed and stable  Post vital signs: Reviewed and stable  Complications: Patient regurgitated gastric contents under GA with LMA. Intubated with oral ETT, breathing tube suctioned. O2 sat stable.

## 2015-10-18 NOTE — Anesthesia Preprocedure Evaluation (Signed)
Anesthesia Evaluation  Patient identified by MRN, date of birth, ID band Patient awake    Reviewed: Allergy & Precautions, NPO status   Airway Mallampati: I  TM Distance: >3 FB Neck ROM: Full    Dental   Pulmonary Current Smoker,    Pulmonary exam normal        Cardiovascular Normal cardiovascular exam     Neuro/Psych    GI/Hepatic   Endo/Other    Renal/GU      Musculoskeletal   Abdominal   Peds  Hematology   Anesthesia Other Findings   Reproductive/Obstetrics                             Anesthesia Physical Anesthesia Plan  ASA: II  Anesthesia Plan: General   Post-op Pain Management:    Induction: Intravenous  Airway Management Planned: LMA  Additional Equipment:   Intra-op Plan:   Post-operative Plan: Extubation in OR  Informed Consent: I have reviewed the patients History and Physical, chart, labs and discussed the procedure including the risks, benefits and alternatives for the proposed anesthesia with the patient or authorized representative who has indicated his/her understanding and acceptance.     Plan Discussed with: CRNA and Surgeon  Anesthesia Plan Comments:         Anesthesia Quick Evaluation

## 2015-10-18 NOTE — H&P (Signed)
Urology Admission H&P  Chief Complaint: dysuria  History of Present Illness: Mr Jamie Short is a 64yo with a hx of BPH with LUTS and urinary retention. He has been managed with an indwelling foley catheter. He underwent UDS which showed a good detrusor contraction but an obstructed pattern. Currently he has irritation from the catheter  Past Medical History  Diagnosis Date  . Hemorrhoids   . BPH (benign prostatic hyperplasia)   . History of gastric ulcer   . Foley catheter in place   . BPH (benign prostatic hyperplasia)   . Urinary retention   . Wears glasses   . Wears dentures   . Arthritis     lower legs   Past Surgical History  Procedure Laterality Date  . Laparoscopic cholecystectomy  2006 approx    Home Medications:  Prescriptions prior to admission  Medication Sig Dispense Refill Last Dose  . acetaminophen-codeine (TYLENOL #3) 300-30 MG tablet Take 1-2 tablets by mouth every 4 (four) hours as needed.   10/17/2015 at Unknown time  . Phenazopyridine HCl (AZO-STANDARD PO) Take by mouth 2 (two) times daily.   10/17/2015 at Unknown time  . silodosin (RAPAFLO) 4 MG CAPS capsule Take 4 mg by mouth daily with breakfast.   10/17/2015 at Unknown time  . solifenacin (VESICARE) 5 MG tablet Take 5 mg by mouth every evening.    10/17/2015 at Unknown time  . traMADol (ULTRAM) 50 MG tablet Take 50 mg by mouth every 6 (six) hours as needed.   10/17/2015 at Unknown time   Allergies: No Known Allergies  History reviewed. No pertinent family history. Social History:  reports that he has been smoking Cigarettes.  He has a 20 pack-year smoking history. He has never used smokeless tobacco. He reports that he does not drink alcohol or use illicit drugs.  Review of Systems  Genitourinary: Positive for dysuria, urgency and frequency.  All other systems reviewed and are negative.   Physical Exam:  Vital signs in last 24 hours: Temp:  [97.6 F (36.4 C)] 97.6 F (36.4 C) (11/07 1310) Pulse Rate:  [69] 69  (11/07 1310) Resp:  [16] 16 (11/07 1310) BP: (110)/(54) 110/54 mmHg (11/07 1310) SpO2:  [97 %] 97 % (11/07 1310) Weight:  [59.421 kg (131 lb)] 59.421 kg (131 lb) (11/07 1310) Physical Exam  Constitutional: He is oriented to person, place, and time. He appears well-developed and well-nourished.  HENT:  Head: Normocephalic and atraumatic.  Eyes: EOM are normal. Pupils are equal, round, and reactive to light.  Neck: Normal range of motion. No thyromegaly present.  Cardiovascular: Normal rate and regular rhythm.   Respiratory: Effort normal. No respiratory distress.  GI: Soft. He exhibits no distension.  Musculoskeletal: Normal range of motion.  Neurological: He is alert and oriented to person, place, and time.  Skin: Skin is warm and dry.  Psychiatric: He has a normal mood and affect. His behavior is normal. Judgment and thought content normal.    Laboratory Data:  No results found for this or any previous visit (from the past 24 hour(s)). No results found for this or any previous visit (from the past 240 hour(s)). Creatinine: No results for input(s): CREATININE in the last 168 hours. Baseline Creatinine: unknown  Impression/Assessment:  64yo with BPH with LUTS, urinary retention, dysuria  Plan:  The risks/benefits/alternatives to TURP was explained to the patient and his son and they understand and wish to proceed with surgery  Londa Mackowski L 10/18/2015, 2:15 PM

## 2015-10-18 NOTE — Brief Op Note (Signed)
10/18/2015  4:21 PM  PATIENT:  Jamie Short  64 y.o. male  PRE-OPERATIVE DIAGNOSIS:  BENIGN PROSTATIC HYPERPLASIA  POST-OPERATIVE DIAGNOSIS:  BENIGHN PROSTATIC HYPERPLASIA  PROCEDURE:  Procedure(s): TRANSURETHRAL RESECTION OF THE PROSTATE WITH GYRUS INSTRUMENTS WITH RESECTION OF BLADDER WALL TUMOR (N/A)  SURGEON:  Surgeon(s) and Role:    * Cleon Gustin, MD - Primary  PHYSICIAN ASSISTANT:   ASSISTANTS: none   ANESTHESIA:   general  EBL:  Total I/O In: 1600 [I.V.:1600] Out: 50 [Other:50]  BLOOD ADMINISTERED:none  DRAINS: Urinary Catheter (Foley)   LOCAL MEDICATIONS USED:  NONE  SPECIMEN:  Source of Specimen:  prostate and bladder lesion  DISPOSITION OF SPECIMEN:  PATHOLOGY  COUNTS:  YES  TOURNIQUET:  * No tourniquets in log *  DICTATION: .Note written in EPIC  PLAN OF CARE: Admit for overnight observation  PATIENT DISPOSITION:  PACU - hemodynamically stable.   Delay start of Pharmacological VTE agent (>24hrs) due to surgical blood loss or risk of bleeding: not applicable

## 2015-10-18 NOTE — Anesthesia Procedure Notes (Addendum)
Procedure Name: LMA Insertion Date/Time: 10/18/2015 2:48 PM Performed by: Mechele Claude Pre-anesthesia Checklist: Patient identified, Emergency Drugs available, Suction available and Patient being monitored Patient Re-evaluated:Patient Re-evaluated prior to inductionOxygen Delivery Method: Circle System Utilized Preoxygenation: Pre-oxygenation with 100% oxygen Intubation Type: IV induction Ventilation: Mask ventilation without difficulty LMA: LMA inserted LMA Size: 4.0 Number of attempts: 1 Airway Equipment and Method: bite block Placement Confirmation: positive ETCO2 Tube secured with: Tape Dental Injury: Teeth and Oropharynx as per pre-operative assessment    Procedure Name: Intubation Date/Time: 10/18/2015 2:58 PM Performed by: Mechele Claude Pre-anesthesia Checklist: Patient identified, Emergency Drugs available, Suction available and Patient being monitored Patient Re-evaluated:Patient Re-evaluated prior to inductionOxygen Delivery Method: Circle System Utilized Preoxygenation: Pre-oxygenation with 100% oxygen Intubation Type: IV induction, Rapid sequence and Cricoid Pressure applied Ventilation: Mask ventilation without difficulty Laryngoscope Size: Mac and 3 Grade View: Grade I Tube type: Oral Tube size: 7.0 mm Number of attempts: 1 Airway Equipment and Method: Stylet Placement Confirmation: ETT inserted through vocal cords under direct vision,  positive ETCO2 and breath sounds checked- equal and bilateral Secured at: 20 cm Tube secured with: Tape Dental Injury: Teeth and Oropharynx as per pre-operative assessment  Comments: Modified RSI with cricoid after patient regurgitated about 50 ml green bilious fluid into LMA and onto floor. Suctioned oropharynx. AOI with 7.0 oral ETT. VSS. Soft suction down ETT with no fluid back. Breath sound clear bil. O2 sat 98%. Dr. Conrad [place] present.

## 2015-10-19 ENCOUNTER — Encounter (HOSPITAL_BASED_OUTPATIENT_CLINIC_OR_DEPARTMENT_OTHER): Payer: Self-pay | Admitting: Urology

## 2015-10-19 DIAGNOSIS — N401 Enlarged prostate with lower urinary tract symptoms: Secondary | ICD-10-CM | POA: Diagnosis not present

## 2015-10-19 DIAGNOSIS — R339 Retention of urine, unspecified: Secondary | ICD-10-CM | POA: Diagnosis not present

## 2015-10-19 DIAGNOSIS — F17210 Nicotine dependence, cigarettes, uncomplicated: Secondary | ICD-10-CM | POA: Diagnosis not present

## 2015-10-19 DIAGNOSIS — R3915 Urgency of urination: Secondary | ICD-10-CM | POA: Diagnosis not present

## 2015-10-19 DIAGNOSIS — R350 Frequency of micturition: Secondary | ICD-10-CM | POA: Diagnosis not present

## 2015-10-19 DIAGNOSIS — R300 Dysuria: Secondary | ICD-10-CM | POA: Diagnosis not present

## 2015-10-19 LAB — CBC
HCT: 43.8 % (ref 39.0–52.0)
Hemoglobin: 14.2 g/dL (ref 13.0–17.0)
MCH: 31 pg (ref 26.0–34.0)
MCHC: 32.4 g/dL (ref 30.0–36.0)
MCV: 95.6 fL (ref 78.0–100.0)
PLATELETS: 288 10*3/uL (ref 150–400)
RBC: 4.58 MIL/uL (ref 4.22–5.81)
RDW: 13.9 % (ref 11.5–15.5)
WBC: 14.7 10*3/uL — ABNORMAL HIGH (ref 4.0–10.5)

## 2015-10-19 LAB — BASIC METABOLIC PANEL
Anion gap: 10 (ref 5–15)
BUN: 17 mg/dL (ref 6–20)
CO2: 19 mmol/L — ABNORMAL LOW (ref 22–32)
CREATININE: 0.69 mg/dL (ref 0.61–1.24)
Calcium: 8.4 mg/dL — ABNORMAL LOW (ref 8.9–10.3)
Chloride: 107 mmol/L (ref 101–111)
GFR calc Af Amer: >60 mL/min (ref >60)
Glucose, Bld: 145 mg/dL — ABNORMAL HIGH (ref 65–99)
Potassium: 4.6 mmol/L (ref 3.5–5.1)
SODIUM: 136 mmol/L (ref 135–145)

## 2015-10-19 NOTE — Progress Notes (Signed)
Called to OR 1 at [facility] center. Pt had lightened up under anesthesia and when legs were raised to be placed in stirups, he regurgitated. He was immediately suctioned out and the LMA removed. He was then intubated and remained stable. O2 sat 98%. Plan to proceed and keep overnight for observation if all goes well.  Lillia Abed MD

## 2015-10-19 NOTE — Progress Notes (Signed)
1 Day Post-Op Subjective: Patient reports mild suprapubic pain which is helped with pain medication. He was admitted to [facility] last night after aspiration event int he operating room requiring emergent intubation.   Objective: Vital signs in last 24 hours: Temp:  [97.2 F (36.2 C)-97.8 F (36.6 C)] 97.8 F (36.6 C) (11/08 0800) Pulse Rate:  [62-95] 71 (11/08 1200) Resp:  [10-20] 14 (11/08 1200) BP: (105-151)/(51-66) 105/51 mmHg (11/08 1200) SpO2:  [92 %-100 %] 95 % (11/08 1200) Weight:  [59.421 kg (131 lb)] 59.421 kg (131 lb) (11/07 1310)  Intake/Output from previous day: 11/07 0701 - 11/08 0700 In: 63817 [I.V.:3475; IV Piggyback:100] Out: 71165 [Urine:19505] Intake/Output this shift: Total I/O In: 3025 [I.V.:625; Other:2400] Out: 3100 [Urine:3100]  Physical Exam:  General:alert, cooperative and appears stated age GI: soft, non tender, normal bowel sounds, no palpable masses, no organomegaly, no inguinal hernia Male genitalia: no penile lesions or discharge no testicular masses no bladder distension noted Extremities: extremities normal, atraumatic, no cyanosis or edema  Lab Results:  Recent Labs  10/18/15 1830 10/19/15 0335  HGB 15.0 14.2  HCT 46.5 43.8   BMET  Recent Labs  10/18/15 1830 10/19/15 0335  NA 139 136  K 3.8 4.6  CL 104 107  CO2 27 19*  GLUCOSE 127* 145*  BUN 17 17  CREATININE 0.94 0.69  CALCIUM 8.8* 8.4*   No results for input(s): LABPT, INR in the last 72 hours. No results for input(s): LABURIN in the last 72 hours. Results for orders placed or performed during the hospital encounter of 02/14/15  Urine culture     Status: None   Collection Time: 02/14/15 12:10 PM  Result Value Ref Range Status   Specimen Description URINE, CATHETERIZED  Final   Special Requests NONE  Final   Colony Count NO GROWTH Performed at [facility]   Final   Culture NO GROWTH Performed at [facility]   Final   Report Status 02/15/2015  FINAL  Final    Studies/Results: No results found.  Assessment/Plan: 64 yo with BPH and bladder lesions s/p TURP and TURBT  Plan:  CBI d/c D/c home with foley     Kalandra Masters L 10/19/2015, 12:06 PM

## 2015-10-19 NOTE — Op Note (Signed)
Preoperative diagnosis: BPH, urinary retention  Postoperative diagnosis: BPH, urinary retention, posterior wall bladder lesion  Procedure: 1 cystoscopy 2. Transurethral resection of the prostate 3. Transurethral resection of a bladder tumor, medium  Attending: Nicolette Bang  Anesthesia: General  Estimated blood loss: Minimal  Drains: 22 French foley  Specimens: 1. Prostate Chips 2. Bladder tumor  Antibiotics: zosyn  Findings: Trilobar prostate enlargement. Ureteral orifices in normal anatomic location. Posterior wall bladder tumor  Indications: Patient is a 64 year old male with a history of BPH and urinary retention.  After discussing treatment options, they decided proceed with transurethral resection of the prostate.  Procedure her in detail: The patient was brought to the operating room and a brief timeout was done to ensure correct patient, correct procedure, correct site.  General anesthesia was administered patient was placed in dorsal lithotomy position.  Their genitalia was then prepped and draped in usual sterile fashion.  A rigid 31 French cystoscope was passed in the urethra and the bladder.  Bladder was inspected and we noted a posterior wall bladder tumor.  the ureteral orifices were in the normal orthotopic locations. removed the cystoscope and placed a resectoscope into the bladder. We then turned our attention to the prostate resection. Using the bipolar resectoscope we resected the median lobe first from the bladder neck to the verumontanum. We then started at the 12 oclock position on the left lobe and resection to the 6 o'clock position from the bladder neck to the verumontanum. We then did the same resection of the right lobe. Once the resection was complete we then cauterized individual bleeders. We then removed the prostate chips and sent them for pathology.  We then re-inspected the prostatic fossa and found no residual bleeding. We then proceeded to resect the  posterior wall bladder tumor. The tumor was resected to the base and then the base and edges of the resection were cauterized to obtain hemostasis. The bladder tumor was then sent for pathology.  the bladder was then drained, a 22 French foley was placed and this concluded the procedure which was well tolerated by patient.  Complications: None  Condition: Stable, extubated, transferred to PACU  Plan: Patient is admitted overnight with continuous bladder irrigation. If their urine is clear tomorrow they will be discharged home and followup in 5 days for foley catheter removal and pathology discussion.

## 2015-10-19 NOTE — Addendum Note (Signed)
Addendum  created 10/19/15 1330 by Lillia Abed, MD   Modules edited: Clinical Notes   Clinical Notes:  File: [id number]; Pend: [id number]

## 2015-10-20 DIAGNOSIS — N401 Enlarged prostate with lower urinary tract symptoms: Secondary | ICD-10-CM | POA: Diagnosis not present

## 2015-10-20 DIAGNOSIS — R339 Retention of urine, unspecified: Secondary | ICD-10-CM | POA: Diagnosis not present

## 2015-10-20 DIAGNOSIS — F17210 Nicotine dependence, cigarettes, uncomplicated: Secondary | ICD-10-CM | POA: Diagnosis not present

## 2015-10-20 DIAGNOSIS — R3915 Urgency of urination: Secondary | ICD-10-CM | POA: Diagnosis not present

## 2015-10-20 DIAGNOSIS — R300 Dysuria: Secondary | ICD-10-CM | POA: Diagnosis not present

## 2015-10-20 DIAGNOSIS — R350 Frequency of micturition: Secondary | ICD-10-CM | POA: Diagnosis not present

## 2015-10-20 LAB — POCT HEMOGLOBIN-HEMACUE: HEMOGLOBIN: 16.8 g/dL (ref 13.0–17.0)

## 2015-10-20 MED ORDER — OXYCODONE-ACETAMINOPHEN 5-325 MG PO TABS: 1.0000 | ORAL_TABLET | ORAL | Status: DC | PRN

## 2015-10-20 MED ORDER — SENNOSIDES 8.8 MG/5ML PO SYRP
5.0000 mL | ORAL_SOLUTION | Freq: Once | ORAL | Status: AC
  Administered 2015-10-20: 5 mL via ORAL
  Filled 2015-10-20: qty 5

## 2015-10-20 MED ORDER — SENNOSIDES 8.8 MG/5ML PO SYRP: 5.0000 mL | ORAL_SOLUTION | Freq: Once | ORAL | Status: DC

## 2015-10-20 NOTE — Discharge Instructions (Signed)
Foley Catheter Care, Adult °A Foley catheter is a soft, flexible tube that is placed into the bladder to drain urine. A Foley catheter may be inserted if: °· You leak urine or are not able to control when you urinate (urinary incontinence). °· You are not able to urinate when you need to (urinary retention). °· You had prostate surgery or surgery on the genitals. °· You have certain medical conditions, such as multiple sclerosis, dementia, or a spinal cord injury. °If you are going home with a Foley catheter in place, follow the instructions below. °TAKING CARE OF THE CATHETER °1. Wash your hands with soap and water. °2. Using mild soap and warm water on a clean washcloth: °¨ Clean the area on your body closest to the catheter insertion site using a circular motion, moving away from the catheter. Never wipe toward the catheter because this could sweep bacteria up into the urethra and cause infection. °¨ Remove all traces of soap. Pat the area dry with a clean towel. For males, reposition the foreskin. °3. Attach the catheter to your leg so there is no tension on the catheter. Use adhesive tape or a leg strap. If you are using adhesive tape, remove any sticky residue left behind by the previous tape you used. °4. Keep the drainage bag below the level of the bladder, but keep it off the floor. °5. Check throughout the day to be sure the catheter is working and urine is draining freely. Make sure the tubing does not become kinked. °6. Do not pull on the catheter or try to remove it. Pulling could damage internal tissues. °TAKING CARE OF THE DRAINAGE BAGS °You will be given two drainage bags to take home. One is a large overnight drainage bag, and the other is a smaller leg bag that fits underneath clothing. You may wear the overnight bag at any time, but you should never wear the smaller leg bag at night. Follow the instructions below for how to empty, change, and clean your drainage bags. °Emptying the Drainage  Bag °You must empty your drainage bag when it is  -½ full or at least 2-3 times a day. °1. Wash your hands with soap and water. °2. Keep the drainage bag below your hips, below the level of your bladder. This stops urine from going back into the tubing and into your bladder. °3. Hold the dirty bag over the toilet or a clean container. °4. Open the pour spout at the bottom of the bag and empty the urine into the toilet or container. Do not let the pour spout touch the toilet, container, or any other surface. Doing so can place bacteria on the bag, which can cause an infection. °5. Clean the pour spout with a gauze pad or cotton ball that has rubbing alcohol on it. °6. Close the pour spout. °7. Attach the bag to your leg with adhesive tape or a leg strap. °8. Wash your hands well. °Changing the Drainage Bag °Change your drainage bag once a month or sooner if it starts to smell bad or look dirty. Below are steps to follow when changing the drainage bag. °1. Wash your hands with soap and water. °2. Pinch off the rubber catheter so that urine does not spill out. °3. Disconnect the catheter tube from the drainage tube at the connection valve. Do not let the tubes touch any surface. °4. Clean the end of the catheter tube with an alcohol wipe. Use a different alcohol wipe to clean   the end of the drainage tube. °5. Connect the catheter tube to the drainage tube of the clean drainage bag. °6. Attach the new bag to the leg with adhesive tape or a leg strap. Avoid attaching the new bag too tightly. °7. Wash your hands well. °Cleaning the Drainage Bag °1. Wash your hands with soap and water. °2. Wash the bag in warm, soapy water. °3. Rinse the bag thoroughly with warm water. °4. Fill the bag with a solution of white vinegar and water (1 cup vinegar to 1 qt warm water [.2 L vinegar to 1 L warm water]). Close the bag and soak it for 30 minutes in the solution. °5. Rinse the bag with warm water. °6. Hang the bag to dry with the  pour spout open and hanging downward. °7. Store the clean bag (once it is dry) in a clean plastic bag. °8. Wash your hands well. °PREVENTING INFECTION °· Wash your hands before and after handling your catheter. °· Take showers daily and wash the area where the catheter enters your body. Do not take baths. Replace wet leg straps with dry ones, if this applies. °· Do not use powders, sprays, or lotions on the genital area. Only use creams, lotions, or ointments as directed by your caregiver. °· For females, wipe from front to back after each bowel movement. °· Drink enough fluids to keep your urine clear or pale yellow unless you have a fluid restriction. °· Do not let the drainage bag or tubing touch or lie on the floor. °· Wear cotton underwear to absorb moisture and to keep your skin drier. °SEEK MEDICAL CARE IF:  °· Your urine is cloudy or smells unusually bad. °· Your catheter becomes clogged. °· You are not draining urine into the bag or your bladder feels full. °· Your catheter starts to leak. °SEEK IMMEDIATE MEDICAL CARE IF:  °· You have pain, swelling, redness, or pus where the catheter enters the body. °· You have pain in the abdomen, legs, lower back, or bladder. °· You have a fever. °· You see blood fill the catheter, or your urine is pink or red. °· You have nausea, vomiting, or chills. °· Your catheter gets pulled out. °MAKE SURE YOU:  °· Understand these instructions. °· Will watch your condition. °· Will get help right away if you are not doing well or get worse. °  °This information is not intended to replace advice given to you by your health care provider. Make sure you discuss any questions you have with your health care provider. °  °Document Released: 11/27/2005 Document Revised: 04/13/2014 Document Reviewed: 11/18/2012 °Elsevier Interactive Patient Education ©2016 [employer] ° °

## 2015-10-20 NOTE — Care Management Note (Signed)
Case Management Note  Patient Details  Name: Xavian Trong Gosling MRN: [id number] Date of Birth: May 30, 1951  Subjective/Objective:   64 y/o m admitted w/BPH. From home.                 Action/Plan:d/c plan home.   Expected Discharge Date:   (unknown)               Expected Discharge Plan:  Home/Self Care  In-House Referral:     Discharge planning Services  CM Consult  Post Acute Care Choice:    Choice offered to:     DME Arranged:    DME Agency:     HH Arranged:    [place] Agency:     Status of Service:  Completed, signed off  Medicare Important Message Given:    Date Medicare IM Given:    Medicare IM give by:    Date Additional Medicare IM Given:    Additional Medicare Important Message give by:     If discussed at [place] of Stay Meetings, dates discussed:    Additional Comments:  Dessa Phi, RN 10/20/2015, 9:57 AM

## 2015-10-25 NOTE — Discharge Summary (Signed)
Physician Discharge Summary  Patient ID: Jamie Short MRN: [id number] DOB/AGE: 16-Aug-1951 64 y.o.  Admit date: 10/18/2015 Discharge date: 10/20/2015  Admission Diagnoses: BPH  Discharge Diagnoses:  Active Problems:   BPH (benign prostatic hyperplasia)   Discharged Condition: good  Hospital Course: The patient tolerated the procedure well and was transferred to the ICU on IV pain meds, IV fluid due to an aspiration event in the OR. On POD#1 pt was started on clear liquid diet and they ambulated in the halls. He was then transferred to the floor. His CBI was then discontinued. On POD#2 the patient was transitioned to a regular diet, IVFs were discontinued, and the patient passed flatus. Prior to discharge the pt was tolerating a regular diet, pain was controlled on PO pain meds, they were ambulating without difficulty, and they had normal bowel function.   Consults: None  Significant Diagnostic Studies: none  Treatments: surgery: TURP  Discharge Exam: Blood pressure 139/76, pulse 63, temperature 98.1 F (36.7 C), temperature source Oral, resp. rate 18, height 5\' 4"  (1.626 m), weight 59.421 kg (131 lb), SpO2 96 %. General appearance: alert, cooperative and appears stated age Head: Normocephalic, without obvious abnormality, atraumatic Eyes: conjunctivae/corneas clear. PERRL, EOM's intact. Fundi benign. Resp: clear to auscultation bilaterally GI: soft, non-tender; bowel sounds normal; no masses,  no organomegaly Extremities: extremities normal, atraumatic, no cyanosis or edema  Disposition: 01-Home or Self Care     Medication List    TAKE these medications        acetaminophen-codeine 300-30 MG tablet  Commonly known as:  TYLENOL #3  Take 1-2 tablets by mouth every 4 (four) hours as needed.     AZO-STANDARD PO  Take by mouth 2 (two) times daily.     oxyCODONE-acetaminophen 5-325 MG tablet  Commonly known as:  PERCOCET/ROXICET  Take 1-2 tablets by mouth every 4 (four) hours  as needed for moderate pain.     sennosides 8.8 MG/5ML syrup  Commonly known as:  SENOKOT  Take 5 mLs by mouth once.     silodosin 4 MG Caps capsule  Commonly known as:  RAPAFLO  Take 4 mg by mouth daily with breakfast.     solifenacin 5 MG tablet  Commonly known as:  VESICARE  Take 5 mg by mouth every evening.     traMADol 50 MG tablet  Commonly known as:  ULTRAM  Take 50 mg by mouth every 6 (six) hours as needed.           Follow-up Information    Follow up with Yailen Zemaitis L, MD. Call in 5 days.   Specialty:  Urology   Contact information:   [address] [place] [address] [phone]       Signed: Cleon Gustin 10/25/2015, 12:23 PM

## 2015-11-09 DIAGNOSIS — M5416 Radiculopathy, lumbar region: Secondary | ICD-10-CM | POA: Diagnosis not present

## 2015-11-19 DIAGNOSIS — M5416 Radiculopathy, lumbar region: Secondary | ICD-10-CM | POA: Diagnosis not present

## 2015-11-30 DIAGNOSIS — N401 Enlarged prostate with lower urinary tract symptoms: Secondary | ICD-10-CM | POA: Diagnosis not present

## 2015-12-14 DIAGNOSIS — M5416 Radiculopathy, lumbar region: Secondary | ICD-10-CM | POA: Diagnosis not present

## 2015-12-15 DIAGNOSIS — M5416 Radiculopathy, lumbar region: Secondary | ICD-10-CM | POA: Diagnosis not present

## 2016-01-03 DIAGNOSIS — M5416 Radiculopathy, lumbar region: Secondary | ICD-10-CM | POA: Diagnosis not present

## 2016-06-01 DIAGNOSIS — N401 Enlarged prostate with lower urinary tract symptoms: Secondary | ICD-10-CM | POA: Diagnosis not present

## 2016-06-01 DIAGNOSIS — N3281 Overactive bladder: Secondary | ICD-10-CM | POA: Diagnosis not present

## 2016-08-10 DIAGNOSIS — Z23 Encounter for immunization: Secondary | ICD-10-CM | POA: Diagnosis not present

## 2016-10-23 DIAGNOSIS — Z136 Encounter for screening for cardiovascular disorders: Secondary | ICD-10-CM | POA: Diagnosis not present

## 2016-10-23 DIAGNOSIS — F17200 Nicotine dependence, unspecified, uncomplicated: Secondary | ICD-10-CM | POA: Diagnosis not present

## 2016-10-23 DIAGNOSIS — Z1211 Encounter for screening for malignant neoplasm of colon: Secondary | ICD-10-CM | POA: Diagnosis not present

## 2017-01-05 DIAGNOSIS — M5416 Radiculopathy, lumbar region: Secondary | ICD-10-CM | POA: Diagnosis not present

## 2017-01-08 DIAGNOSIS — N401 Enlarged prostate with lower urinary tract symptoms: Secondary | ICD-10-CM | POA: Diagnosis not present

## 2017-01-08 DIAGNOSIS — R351 Nocturia: Secondary | ICD-10-CM | POA: Diagnosis not present

## 2017-01-08 DIAGNOSIS — N3281 Overactive bladder: Secondary | ICD-10-CM | POA: Diagnosis not present

## 2017-01-10 ENCOUNTER — Other Ambulatory Visit: Payer: Self-pay | Admitting: Family Medicine

## 2017-01-10 DIAGNOSIS — F17210 Nicotine dependence, cigarettes, uncomplicated: Secondary | ICD-10-CM

## 2017-01-10 DIAGNOSIS — Z049 Encounter for examination and observation for unspecified reason: Secondary | ICD-10-CM | POA: Diagnosis not present

## 2017-01-10 DIAGNOSIS — Z79899 Other long term (current) drug therapy: Secondary | ICD-10-CM | POA: Diagnosis not present

## 2017-01-10 DIAGNOSIS — R7309 Other abnormal glucose: Secondary | ICD-10-CM | POA: Diagnosis not present

## 2017-01-16 ENCOUNTER — Inpatient Hospital Stay
Admission: RE | Admit: 2017-01-16 | Discharge: 2017-01-16 | Disposition: A | Discharge status: Home / Self Care | Payer: Medicare Other | Source: Ambulatory Visit | Attending: Family Medicine | Admitting: Family Medicine

## 2017-01-19 ENCOUNTER — Ambulatory Visit
Admission: RE | Admit: 2017-01-19 | Discharge: 2017-01-19 | Disposition: A | Discharge status: Home / Self Care | Payer: Medicare Other | Source: Ambulatory Visit | Attending: Family Medicine | Admitting: Family Medicine

## 2017-01-19 DIAGNOSIS — Z87891 Personal history of nicotine dependence: Secondary | ICD-10-CM | POA: Diagnosis not present

## 2017-01-19 DIAGNOSIS — F17210 Nicotine dependence, cigarettes, uncomplicated: Secondary | ICD-10-CM

## 2017-02-05 DIAGNOSIS — N3281 Overactive bladder: Secondary | ICD-10-CM | POA: Diagnosis not present

## 2017-02-05 DIAGNOSIS — N401 Enlarged prostate with lower urinary tract symptoms: Secondary | ICD-10-CM | POA: Diagnosis not present

## 2017-02-13 DIAGNOSIS — R0981 Nasal congestion: Secondary | ICD-10-CM | POA: Diagnosis not present

## 2017-02-13 DIAGNOSIS — R05 Cough: Secondary | ICD-10-CM | POA: Diagnosis not present

## 2017-03-05 DIAGNOSIS — N3281 Overactive bladder: Secondary | ICD-10-CM | POA: Diagnosis not present

## 2017-03-05 DIAGNOSIS — N401 Enlarged prostate with lower urinary tract symptoms: Secondary | ICD-10-CM | POA: Diagnosis not present

## 2017-08-18 DIAGNOSIS — Z23 Encounter for immunization: Secondary | ICD-10-CM | POA: Diagnosis not present

## 2017-08-24 IMAGING — CT CT CHEST LUNG CANCER SCREENING LOW DOSE W/O CM
1 of 2 series · 10 of 40 positions shown, 13 images · non-contrast
Comparison: None.

CLINICAL DATA: Thirty pack-year smoking history. Quit 2 months ago.

EXAM:
CT CHEST WITHOUT CONTRAST LOW-DOSE FOR LUNG CANCER SCREENING
TECHNIQUE: Multidetector CT imaging of the chest was performed following the
standard protocol without IV contrast.

[ct lung segmentation data · axial · 0.62mm/px · z∈[-293,-293]mm · 10 of 279 frames shown]
[frame 1/279  mediastinal]
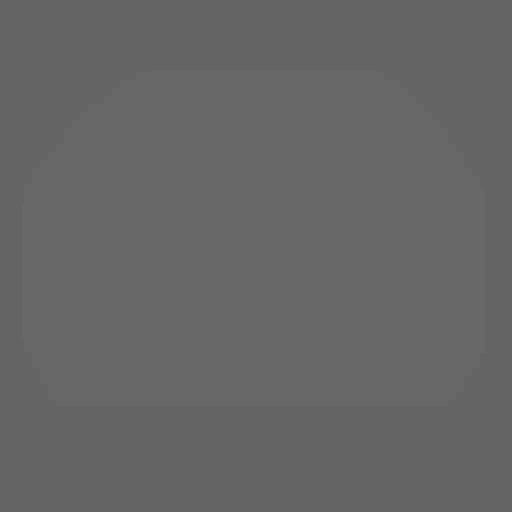
[frame 1/279  lung]
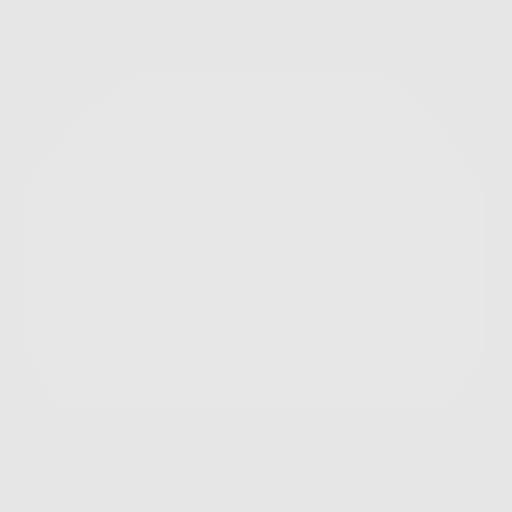
[frame 31/279  lung]
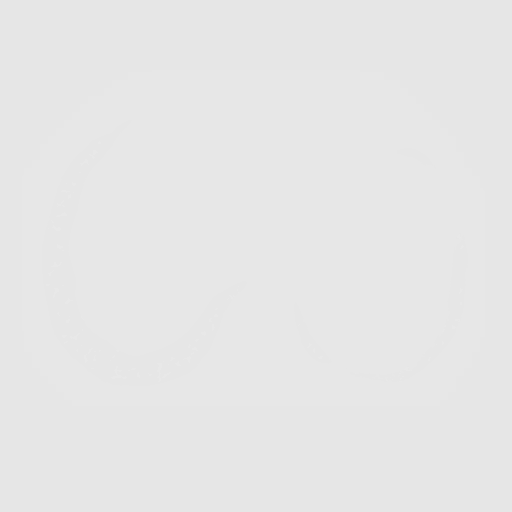
[frame 62/279  lung]
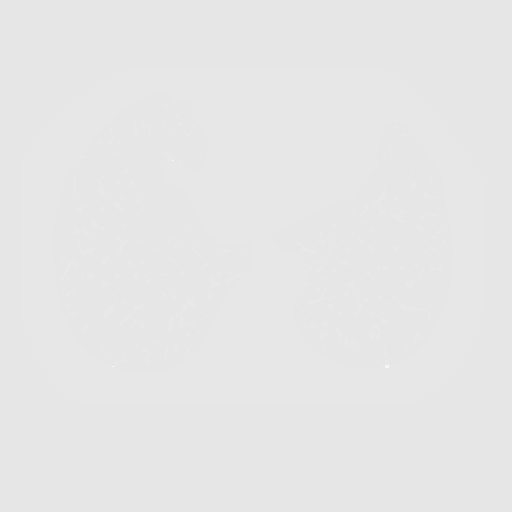
[frame 93/279  lung]
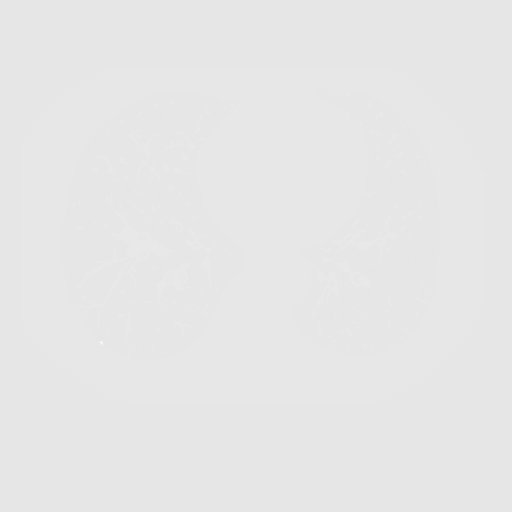
[frame 124/279  mediastinal]
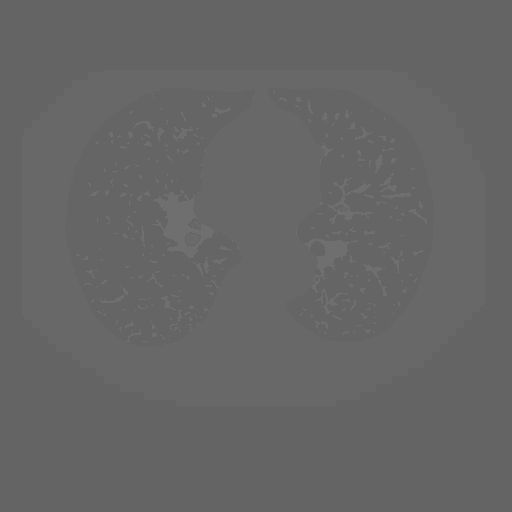
[frame 124/279  lung]
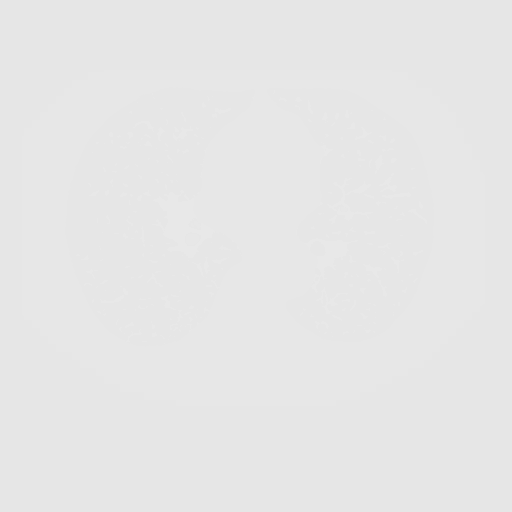
[frame 155/279  lung]
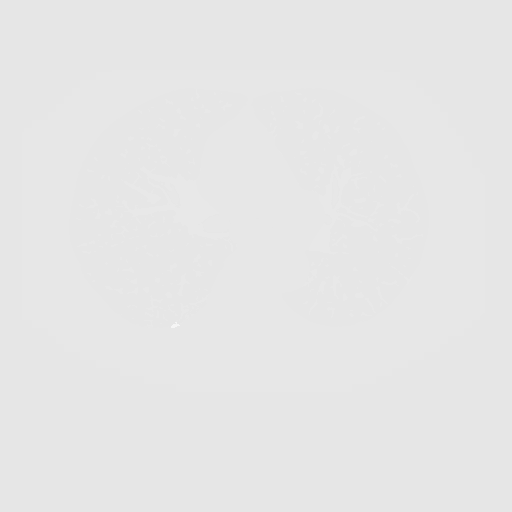
[frame 186/279  lung]
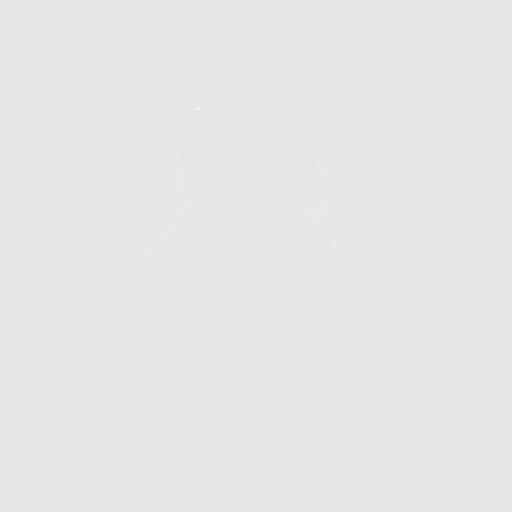
[frame 217/279  lung]
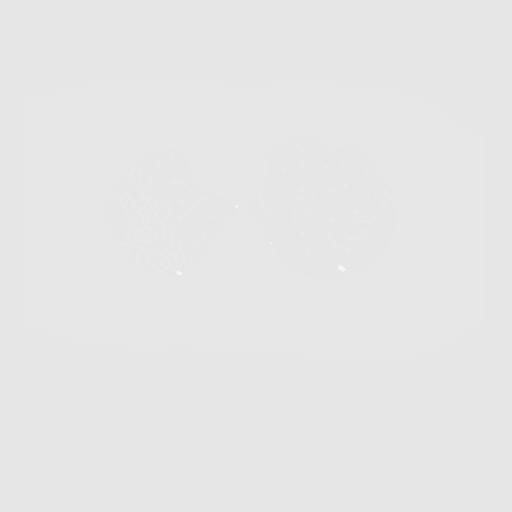
[frame 248/279  mediastinal]
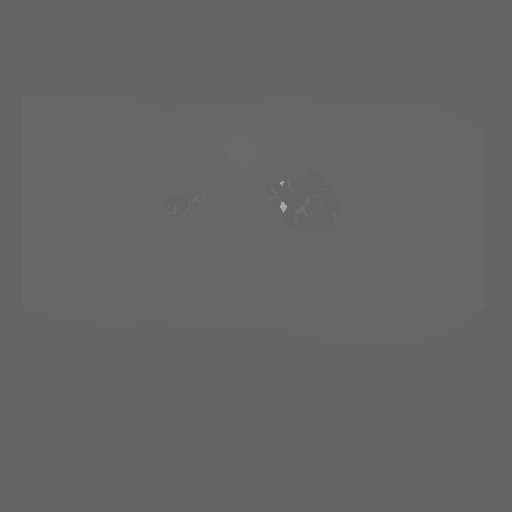
[frame 248/279  lung]
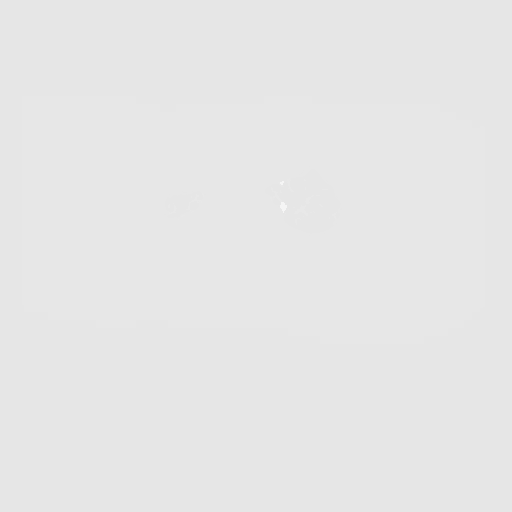
[frame 279/279  lung]
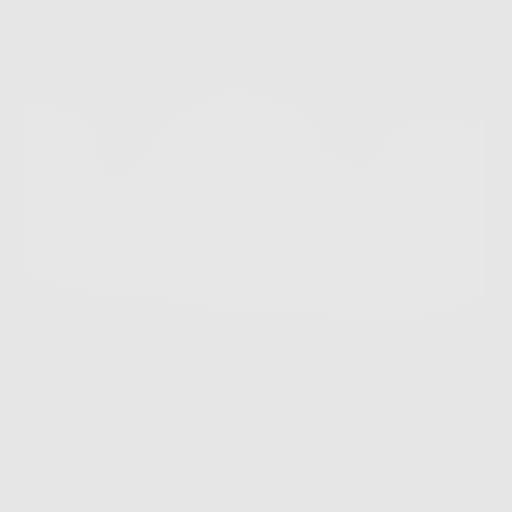

[10 of 40 positions shown; findings below may reference images not displayed]

FINDINGS: Cardiovascular: Aortic and branch vessel atherosclerosis. Borderline
cardiomegaly, without pericardial effusion. Lad coronary artery
atherosclerosis.

Mediastinum/Nodes: No mediastinal or definite hilar adenopathy,
given limitations of unenhanced CT.

Lungs/Pleura: No pleural fluid.  Moderate centrilobular emphysema.

Multiple left-sided calcified granulomas.

Left upper lobe ground-glass nodule measures volume derived
equivalent diameter 13.0 mm, including on image 105/series 3.

Upper Abdomen: Multiple hepatic cysts, including a dominant
posterior right hepatic lobe 7.6 cm lesion. Normal imaged portions
of the spleen, stomach, adrenal glands, kidneys.

Musculoskeletal: No acute osseous abnormality.
IMPRESSION: 1. Lung-RADS Category 2, benign appearance or behavior. Continue
annual screening with low-dose chest CT without contrast in 12
months.
2.  Coronary artery atherosclerosis. Aortic atherosclerosis.

## 2017-09-04 DIAGNOSIS — R252 Cramp and spasm: Secondary | ICD-10-CM | POA: Diagnosis not present

## 2017-09-04 DIAGNOSIS — E876 Hypokalemia: Secondary | ICD-10-CM | POA: Diagnosis not present

## 2020-02-25 ENCOUNTER — Inpatient Hospital Stay (HOSPITAL_COMMUNITY)
Admission: EM | Admit: 2020-02-25 | Discharge: 2020-02-27 | DRG: 378 | Disposition: A | Discharge status: Home / Self Care | Payer: Medicare Other | Attending: Internal Medicine | Admitting: Internal Medicine

## 2020-02-25 ENCOUNTER — Other Ambulatory Visit: Payer: Self-pay

## 2020-02-25 ENCOUNTER — Encounter (HOSPITAL_COMMUNITY): Payer: Self-pay | Admitting: Family Medicine

## 2020-02-25 DIAGNOSIS — Z87891 Personal history of nicotine dependence: Secondary | ICD-10-CM

## 2020-02-25 DIAGNOSIS — K625 Hemorrhage of anus and rectum: Secondary | ICD-10-CM | POA: Diagnosis present

## 2020-02-25 DIAGNOSIS — Z20822 Contact with and (suspected) exposure to covid-19: Secondary | ICD-10-CM | POA: Diagnosis present

## 2020-02-25 DIAGNOSIS — K5731 Diverticulosis of large intestine without perforation or abscess with bleeding: Secondary | ICD-10-CM | POA: Diagnosis present

## 2020-02-25 DIAGNOSIS — M1990 Unspecified osteoarthritis, unspecified site: Secondary | ICD-10-CM | POA: Diagnosis present

## 2020-02-25 DIAGNOSIS — Z79899 Other long term (current) drug therapy: Secondary | ICD-10-CM | POA: Diagnosis not present

## 2020-02-25 DIAGNOSIS — I951 Orthostatic hypotension: Secondary | ICD-10-CM | POA: Diagnosis present

## 2020-02-25 DIAGNOSIS — K922 Gastrointestinal hemorrhage, unspecified: Secondary | ICD-10-CM | POA: Diagnosis not present

## 2020-02-25 DIAGNOSIS — D62 Acute posthemorrhagic anemia: Secondary | ICD-10-CM | POA: Diagnosis present

## 2020-02-25 DIAGNOSIS — K921 Melena: Secondary | ICD-10-CM | POA: Diagnosis not present

## 2020-02-25 DIAGNOSIS — E860 Dehydration: Secondary | ICD-10-CM | POA: Diagnosis present

## 2020-02-25 DIAGNOSIS — K648 Other hemorrhoids: Secondary | ICD-10-CM | POA: Diagnosis present

## 2020-02-25 DIAGNOSIS — K649 Unspecified hemorrhoids: Secondary | ICD-10-CM | POA: Diagnosis not present

## 2020-02-25 DIAGNOSIS — N400 Enlarged prostate without lower urinary tract symptoms: Secondary | ICD-10-CM | POA: Diagnosis present

## 2020-02-25 DIAGNOSIS — Z8711 Personal history of peptic ulcer disease: Secondary | ICD-10-CM

## 2020-02-25 LAB — CBC WITH DIFFERENTIAL/PLATELET
Abs Immature Granulocytes: 0.05 10*3/uL (ref 0.00–0.07)
Basophils Absolute: 0.1 10*3/uL (ref 0.0–0.1)
Basophils Relative: 1 %
Eosinophils Absolute: 0.7 10*3/uL — ABNORMAL HIGH (ref 0.0–0.5)
Eosinophils Relative: 6 %
HCT: 24.2 % — ABNORMAL LOW (ref 39.0–52.0)
Hemoglobin: 7.7 g/dL — ABNORMAL LOW (ref 13.0–17.0)
Immature Granulocytes: 0 %
Lymphocytes Relative: 11 %
Lymphs Abs: 1.3 10*3/uL (ref 0.7–4.0)
MCH: 31.8 pg (ref 26.0–34.0)
MCHC: 31.8 g/dL (ref 30.0–36.0)
MCV: 100 fL (ref 80.0–100.0)
Monocytes Absolute: 0.7 10*3/uL (ref 0.1–1.0)
Monocytes Relative: 6 %
Neutro Abs: 8.4 10*3/uL — ABNORMAL HIGH (ref 1.7–7.7)
Neutrophils Relative %: 76 %
Platelets: 211 10*3/uL (ref 150–400)
RBC: 2.42 MIL/uL — ABNORMAL LOW (ref 4.22–5.81)
RDW: 14.2 % (ref 11.5–15.5)
WBC: 11.1 10*3/uL — ABNORMAL HIGH (ref 4.0–10.5)
nRBC: 0 % (ref 0.0–0.2)

## 2020-02-25 LAB — BASIC METABOLIC PANEL
Anion gap: 3 — ABNORMAL LOW (ref 5–15)
BUN: 25 mg/dL — ABNORMAL HIGH (ref 8–23)
CO2: 27 mmol/L (ref 22–32)
Calcium: 8.2 mg/dL — ABNORMAL LOW (ref 8.9–10.3)
Chloride: 110 mmol/L (ref 98–111)
Creatinine, Ser: 0.88 mg/dL (ref 0.61–1.24)
GFR calc Af Amer: >60 mL/min (ref >60)
GFR calc non Af Amer: >60 mL/min (ref >60)
Glucose, Bld: 112 mg/dL — ABNORMAL HIGH (ref 70–99)
Potassium: 4.2 mmol/L (ref 3.5–5.1)
Sodium: 140 mmol/L (ref 135–145)

## 2020-02-25 LAB — CBC
HCT: 24.3 % — ABNORMAL LOW (ref 39.0–52.0)
HCT: 25.7 % — ABNORMAL LOW (ref 39.0–52.0)
HCT: 24.1 % — ABNORMAL LOW (ref 39.0–52.0)
Hemoglobin: 7.5 g/dL — ABNORMAL LOW (ref 13.0–17.0)
Hemoglobin: 8.1 g/dL — ABNORMAL LOW (ref 13.0–17.0)
Hemoglobin: 7.7 g/dL — ABNORMAL LOW (ref 13.0–17.0)
MCH: 31 pg (ref 26.0–34.0)
MCH: 31.6 pg (ref 26.0–34.0)
MCH: 32 pg (ref 26.0–34.0)
MCHC: 30.9 g/dL (ref 30.0–36.0)
MCHC: 31.5 g/dL (ref 30.0–36.0)
MCHC: 32 g/dL (ref 30.0–36.0)
MCV: 100.4 fL — ABNORMAL HIGH (ref 80.0–100.0)
MCV: 100.4 fL — ABNORMAL HIGH (ref 80.0–100.0)
MCV: 100 fL (ref 80.0–100.0)
Platelets: 225 10*3/uL (ref 150–400)
Platelets: 246 10*3/uL (ref 150–400)
Platelets: 228 10*3/uL (ref 150–400)
RBC: 2.42 MIL/uL — ABNORMAL LOW (ref 4.22–5.81)
RBC: 2.56 MIL/uL — ABNORMAL LOW (ref 4.22–5.81)
RBC: 2.41 MIL/uL — ABNORMAL LOW (ref 4.22–5.81)
RDW: 14.4 % (ref 11.5–15.5)
RDW: 14.3 % (ref 11.5–15.5)
RDW: 14.4 % (ref 11.5–15.5)
WBC: 11.8 10*3/uL — ABNORMAL HIGH (ref 4.0–10.5)
WBC: 12.5 10*3/uL — ABNORMAL HIGH (ref 4.0–10.5)
WBC: 12 10*3/uL — ABNORMAL HIGH (ref 4.0–10.5)
nRBC: 0 % (ref 0.0–0.2)
nRBC: 0 % (ref 0.0–0.2)
nRBC: 0 % (ref 0.0–0.2)

## 2020-02-25 LAB — SARS CORONAVIRUS 2 (TAT 6-24 HRS): SARS Coronavirus 2: NEGATIVE

## 2020-02-25 LAB — HIV ANTIBODY (ROUTINE TESTING W REFLEX): HIV Screen 4th Generation wRfx: NONREACTIVE

## 2020-02-25 LAB — POC OCCULT BLOOD, ED: Fecal Occult Bld: POSITIVE — AB

## 2020-02-25 LAB — ABO/RH: ABO/RH(D): B POS

## 2020-02-25 MED ORDER — PEG-KCL-NACL-NASULF-NA ASC-C 100 G PO SOLR: 1.0000 | Freq: Once | ORAL | Status: DC

## 2020-02-25 MED ORDER — SODIUM CHLORIDE 0.9 % IV SOLN: Freq: Once | INTRAVENOUS | Status: AC

## 2020-02-25 MED ORDER — PEG-KCL-NACL-NASULF-NA ASC-C 100 G PO SOLR
0.5000 | Freq: Once | ORAL | Status: AC
  Administered 2020-02-25: 100 g via ORAL
  Filled 2020-02-25: qty 1

## 2020-02-25 MED ORDER — PEG-KCL-NACL-NASULF-NA ASC-C 100 G PO SOLR
0.5000 | Freq: Once | ORAL | Status: AC
  Administered 2020-02-25: 21:00:00 100 g via ORAL
  Filled 2020-02-25: qty 1

## 2020-02-25 MED ORDER — GABAPENTIN 300 MG PO CAPS
300.0000 mg | ORAL_CAPSULE | Freq: Three times a day (TID) | ORAL | Status: DC
  Administered 2020-02-25 – 2020-02-27 (×4): 300 mg via ORAL
  Filled 2020-02-25 (×4): qty 1

## 2020-02-25 MED ORDER — ACETAMINOPHEN 325 MG PO TABS: 650.0000 mg | ORAL_TABLET | Freq: Four times a day (QID) | ORAL | Status: DC | PRN

## 2020-02-25 MED ORDER — ONDANSETRON HCL 4 MG/2ML IJ SOLN: 4.0000 mg | Freq: Four times a day (QID) | INTRAMUSCULAR | Status: DC | PRN

## 2020-02-25 MED ORDER — SODIUM CHLORIDE 0.9% IV SOLUTION: Freq: Once | INTRAVENOUS | Status: DC

## 2020-02-25 MED ORDER — GLUCOSAMINE 500 MG PO CAPS: ORAL_CAPSULE | Freq: Two times a day (BID) | ORAL | Status: DC

## 2020-02-25 MED ORDER — ACETAMINOPHEN 650 MG RE SUPP: 650.0000 mg | Freq: Four times a day (QID) | RECTAL | Status: DC | PRN

## 2020-02-25 MED ORDER — ONDANSETRON HCL 4 MG PO TABS: 4.0000 mg | ORAL_TABLET | Freq: Four times a day (QID) | ORAL | Status: DC | PRN

## 2020-02-25 NOTE — H&P (Addendum)
History and Physical    Jamie Short  [id number]  DOB: 1951/05/10  DOA: 02/25/2020 PCP: Tresa Garter, MD   Patient coming from: home with wife  Chief Complaint: blood per rectum x 5 days  HPI: Jamie Short is a 69 y.o. male with medical history of BPH who presents to the hospital with 5 days of bright red blood per rectum which has been painless. A note from 2015 in Epic notes that he has hemorrhoids and uses Anusol. The patient's son thought the patient may have hemorrhoidal bleed and asked him to pick up some hemorrhoid medication but despite using this, his bleeding did not resolve.  His last episode of bleeding was this morning prior to coming into the ED.  What prompted him to come to the ED was the fact that he was beginning to feel lightheaded.  He has been eating regular food and last had rice yesterday evening.  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   He has not had any nausea vomiting or any recent weight loss.  He has no complaints of urinary issues at this time.  No recent contact with Covid positive people.  He has no sore throat sinus drainage or cough.   ED Course: Hemoglobin 7.5, MCV 100.4, BUN 25 with a creatinine of 0.88 Orthostatic vitals are positive Fecal occult blood is positive  Review of Systems:  All other systems reviewed and apart from HPI, are negative.  Past Medical History:  Diagnosis Date  . Arthritis    lower legs  . BPH (benign prostatic hyperplasia)   . BPH (benign prostatic hyperplasia)   . Foley catheter in place   . Hemorrhoids   . Urinary retention   . Wears dentures   . Wears glasses   He does not have a history of a gastric ulcer  Past Surgical History:  Procedure Laterality Date  . LAPAROSCOPIC CHOLECYSTECTOMY  2006 approx  . TRANSURETHRAL RESECTION OF PROSTATE N/A 10/18/2015   Procedure: TRANSURETHRAL RESECTION OF THE PROSTATE WITH GYRUS INSTRUMENTS WITH RESECTION OF  BLADDER WALL TUMOR;  Surgeon: Cleon Gustin, MD;  Location: [facility];  Service: Urology;  Laterality: N/A;    Social History:  He stopped smoking 1 year ago.  He does not drink any alcohol.  He lives with his wife and is able to perform his activities of daily living.  No Known Allergies  History reviewed. No pertinent family history.   Prior to Admission medications   Medication Sig Start Date End Date Taking? Authorizing Provider  acetaminophen (TYLENOL) 325 MG tablet Take 650 mg by mouth every 6 (six) hours as needed for mild pain or headache.   Yes [provider]  gabapentin (NEURONTIN) 300 MG capsule Take 300 mg by mouth 3 (three) times daily. 01/27/20  Yes [provider]  Glucosamine HCl (GLUCOSAMINE PO) Take 1 tablet by mouth 2 (two) times daily.   Yes [provider]  hydrocortisone (ANUSOL-HC) 2.5 % rectal cream Place 1 application rectally 4 (four) times daily as needed for hemorrhoids or anal itching.   Yes [provider]  lovastatin (MEVACOR) 20 MG tablet Take 20 mg by mouth at bedtime. 01/27/20  Yes [provider]  meloxicam (MOBIC) 15 MG tablet Take 7.5-15 mg by mouth daily as needed for muscle spasms. 01/27/20  Yes [provider]  oxyCODONE-acetaminophen (PERCOCET/ROXICET) 5-325 MG tablet Take 1-2 tablets by mouth  every 4 (four) hours as needed for moderate pain. Patient not taking: Reported on 02/25/2020 10/20/15   Cleon Gustin, MD  sennosides (SENOKOT) 8.8 MG/5ML syrup Take 5 mLs by mouth once. Patient not taking: Reported on 02/25/2020 10/20/15   Cleon Gustin, MD    Physical Exam: Wt Readings from Last 3 Encounters:  02/25/20 68 kg  10/18/15 59.4 kg  06/04/15 63.5 kg   Vitals:   02/25/20 0900 02/25/20 0902 02/25/20 0903 02/25/20 1300  BP: 116/61   136/76  Pulse: 72   68  Resp:    17  Temp:  98.1 F (36.7 C)    TempSrc:  Oral    SpO2: 100% 100%  100%  Weight:   68 kg     Height:   5\' 4"  (1.626 m)       Constitutional:  Calm & comfortable Eyes: PERRLA, lids and conjunctivae normal ENT:  Mucous membranes are moist.  Pharynx clear of exudate   Normal dentition.  Neck: Supple, no masses  Respiratory:  Clear to auscultation bilaterally  Normal respiratory effort.  Cardiovascular:  S1 & S2 heard, regular rate and rhythm No Murmurs Abdomen:  Non distended No tenderness, No masses Bowel sounds normal Extremities:  No clubbing / cyanosis No pedal edema No joint deformity    Skin:  No rashes, lesions or ulcers Neurologic:  AAO x 3 CN 2-12 grossly intact Sensation intact Strength 5/5 in all 4 extremities Psychiatric:  Normal Mood and affect    Labs on Admission: I have personally reviewed following labs and imaging studies  CBC: Recent Labs  Lab 02/25/20 0955 02/25/20 1250  WBC 11.1* 11.8*  NEUTROABS 8.4*  --   HGB 7.7* 7.5*  HCT 24.2* 24.3*  MCV 100.0 100.4*  PLT 211 [id number]   Basic Metabolic Panel: Recent Labs  Lab 02/25/20 0955  NA 140  K 4.2  CL 110  CO2 27  GLUCOSE 112*  BUN 25*  CREATININE 0.88  CALCIUM 8.2*   GFR: Estimated Creatinine Clearance: 65.4 mL/min (by C-G formula based on SCr of 0.88 mg/dL). Liver Function Tests: No results for input(s): AST, ALT, ALKPHOS, BILITOT, PROT, ALBUMIN in the last 168 hours. No results for input(s): LIPASE, AMYLASE in the last 168 hours. No results for input(s): AMMONIA in the last 168 hours. Coagulation Profile: No results for input(s): INR, PROTIME in the last 168 hours. Cardiac Enzymes: No results for input(s): CKTOTAL, CKMB, CKMBINDEX, TROPONINI in the last 168 hours. BNP (last 3 results) No results for input(s): PROBNP in the last 8760 hours. HbA1C: No results for input(s): HGBA1C in the last 72 hours. CBG: No results for input(s): GLUCAP in the last 168 hours. Lipid Profile: No results for input(s): CHOL, HDL, LDLCALC, TRIG, CHOLHDL, LDLDIRECT in the last 72  hours. Thyroid Function Tests: No results for input(s): TSH, T4TOTAL, FREET4, T3FREE, THYROIDAB in the last 72 hours. Anemia Panel: No results for input(s): VITAMINB12, FOLATE, FERRITIN, TIBC, IRON, RETICCTPCT in the last 72 hours. Urine analysis:    Component Value Date/Time   COLORURINE YELLOW 09/11/2015 0900   APPEARANCEUR CLEAR 09/11/2015 0900   LABSPEC 1.015 09/11/2015 0900   PHURINE 7.0 09/11/2015 0900   GLUCOSEU NEGATIVE 09/11/2015 0900   HGBUR NEGATIVE 09/11/2015 0900   BILIRUBINUR NEGATIVE 09/11/2015 0900   KETONESUR NEGATIVE 09/11/2015 0900   PROTEINUR NEGATIVE 09/11/2015 0900   UROBILINOGEN 0.2 09/11/2015 0900   NITRITE NEGATIVE 09/11/2015 0900   LEUKOCYTESUR NEGATIVE 09/11/2015 0900   Sepsis Labs: @LABRCNTIP (procalcitonin:4,lacticidven:4) )No  results found for this or any previous visit (from the past 240 hour(s)).   Radiological Exams on Admission: No results found.  EKG: Independently reviewed. NO EKG  Assessment/Plan Principal Problem:   GI bleed with orthostatic hypotension/ dehydration BP 129/65> 112/68/> 111/55 with HR going from 69 yo 78 with standing - painless but he has been on Mobic and this may be upper GI bleed- BUN elevated but this may also be from dehydration- will start Protonix - GI has been consulted - I have ordered clear liquids and IVF  Active Problems:   Anemia associated with acute blood loss  - 1 U PRBC ordered by ED- check Hb Q 8 hrs  - MCV slightly elevated- checking anemia panel  Arthritis and back pain - hold Mobic- cont Neurontin, Glucosamine and Oxycodone  BPH - not on medications for this   DVT prophylaxis: SCDs  Code Status: Full code  Family Communication: son  Disposition Plan: from home with wife will need PT eval once bleeding stops to determine if he should go back home Consults called: [facility]  Admission status: inpatient    Debbe Odea MD [facility] Pager: [email or web address] Password [email or web address] 7PM-7AM,  please contact night-coverage   02/25/2020, 1:39 PM

## 2020-02-25 NOTE — ED Triage Notes (Addendum)
Pt from home, accompanied by son.  Pt declined interpreter at this time, prefers son to interpret.   Per son- Pt has c/o "a lot" of bloody stool, diarrhea x5 days.  Denies abd pain, denies n/v.  Pt reports that this has happened 5-6 months ago, lasted one day. Pt denies dizziness at this time.

## 2020-02-25 NOTE — ED Notes (Signed)
Pt. Had a loose stool. Nurse aware.

## 2020-02-25 NOTE — Consult Note (Signed)
Consultation  Referring Provider:   Dr. Wynelle Cleveland Primary Care Physician:  Tresa Garter, MD Primary Gastroenterologist:   Althia Forts      Reason for Consultation:   Rectal bleeding         HPI:   Jamie Short is a 69 y.o. [place] male with a past medical history as listed below including BPH and hemorrhoids, who presented to the ED today after 5 days of rectal bleeding.    Today, patient's son acts as a Optometrist and explains that the patient has had rectal bleeding which started 5 days ago with loose stools and bright red blood about 2-3 times a day, the last one this morning.  Apparently tried treating this with over-the-counter hemorrhoid cream which has not helped at all.  Describes previous issues with hemorrhoids, just one time which resolved with OTC hemorrhoid cream.  This morning patient reports feeling significantly weak with lightheadedness and dizziness upon standing.  They decided it was time to come to the ER.    Denies fever, chills, weight loss, change in bowel habits, SOB or abdominal pain.  ER course: Hemoglobin 7.7 (previously 14.2 on 10/19/2015), white blood cell count elevated 11.1, BUN elevated at 25  Past Medical History:  Diagnosis Date  . Arthritis    lower legs  . BPH (benign prostatic hyperplasia)   . BPH (benign prostatic hyperplasia)   . Foley catheter in place   . Hemorrhoids   . History of gastric ulcer   . Urinary retention   . Wears dentures   . Wears glasses     Past Surgical History:  Procedure Laterality Date  . LAPAROSCOPIC CHOLECYSTECTOMY  2006 approx  . TRANSURETHRAL RESECTION OF PROSTATE N/A 10/18/2015   Procedure: TRANSURETHRAL RESECTION OF THE PROSTATE WITH GYRUS INSTRUMENTS WITH RESECTION OF BLADDER WALL TUMOR;  Surgeon: Cleon Gustin, MD;  Location: [facility];  Service: Urology;  Laterality: N/A;    History reviewed. No pertinent family history.  Social History   Tobacco Use  . Smoking status:  Current Every Day Smoker    Packs/day: 0.50    Years: 40.00    Pack years: 20.00    Types: Cigarettes  . Smokeless tobacco: Never Used  Substance Use Topics  . Alcohol use: No  . Drug use: No    Prior to Admission medications   Medication Sig Start Date End Date Taking? Authorizing Provider  acetaminophen (TYLENOL) 325 MG tablet Take 650 mg by mouth every 6 (six) hours as needed for mild pain or headache.   Yes [provider]  gabapentin (NEURONTIN) 300 MG capsule Take 300 mg by mouth 3 (three) times daily. 01/27/20  Yes [provider]  Glucosamine HCl (GLUCOSAMINE PO) Take 1 tablet by mouth 2 (two) times daily.   Yes [provider]  hydrocortisone (ANUSOL-HC) 2.5 % rectal cream Place 1 application rectally 4 (four) times daily as needed for hemorrhoids or anal itching.   Yes [provider]  lovastatin (MEVACOR) 20 MG tablet Take 20 mg by mouth at bedtime. 01/27/20  Yes [provider]  meloxicam (MOBIC) 15 MG tablet Take 7.5-15 mg by mouth daily as needed for muscle spasms. 01/27/20  Yes [provider]  oxyCODONE-acetaminophen (PERCOCET/ROXICET) 5-325 MG tablet Take 1-2 tablets by mouth every 4 (four) hours as needed for moderate pain. Patient not taking: Reported on 02/25/2020 10/20/15   Cleon Gustin, MD  sennosides (SENOKOT) 8.8 MG/5ML syrup Take 5 mLs by mouth  once. Patient not taking: Reported on 02/25/2020 10/20/15   Cleon Gustin, MD    Current Facility-Administered Medications  Medication Dose Route Frequency Provider Last Rate Last Admin  . 0.9 %  sodium chloride infusion (Manually program via Guardrails IV Fluids)   Intravenous Once Matilde Haymaker, MD      . 0.9 %  sodium chloride infusion   Intravenous Once Matilde Haymaker, MD      . acetaminophen (TYLENOL) tablet 650 mg  650 mg Oral Q6H PRN Debbe Odea, MD       Or  . acetaminophen (TYLENOL) suppository 650 mg  650 mg Rectal Q6H PRN Debbe Odea, MD      .  ondansetron (ZOFRAN) tablet 4 mg  4 mg Oral Q6H PRN Debbe Odea, MD       Or  . ondansetron (ZOFRAN) injection 4 mg  4 mg Intravenous Q6H PRN Debbe Odea, MD       Current Outpatient Medications  Medication Sig Dispense Refill  . acetaminophen (TYLENOL) 325 MG tablet Take 650 mg by mouth every 6 (six) hours as needed for mild pain or headache.    . gabapentin (NEURONTIN) 300 MG capsule Take 300 mg by mouth 3 (three) times daily.    . Glucosamine HCl (GLUCOSAMINE PO) Take 1 tablet by mouth 2 (two) times daily.    . hydrocortisone (ANUSOL-HC) 2.5 % rectal cream Place 1 application rectally 4 (four) times daily as needed for hemorrhoids or anal itching.    . lovastatin (MEVACOR) 20 MG tablet Take 20 mg by mouth at bedtime.    . meloxicam (MOBIC) 15 MG tablet Take 7.5-15 mg by mouth daily as needed for muscle spasms.    Marland Kitchen oxyCODONE-acetaminophen (PERCOCET/ROXICET) 5-325 MG tablet Take 1-2 tablets by mouth every 4 (four) hours as needed for moderate pain. (Patient not taking: Reported on 02/25/2020) 30 tablet 0  . sennosides (SENOKOT) 8.8 MG/5ML syrup Take 5 mLs by mouth once. (Patient not taking: Reported on 02/25/2020) 240 mL 0    Allergies as of 02/25/2020  . (No Known Allergies)     Review of Systems:    Constitutional: No weight loss, fever or chills Skin: No rash  Cardiovascular: No chest pain Respiratory: No SOB Gastrointestinal: See HPI and otherwise negative Genitourinary: No dysuria or change in urinary frequency Neurological: +dizziness Musculoskeletal: No new muscle or joint pain Hematologic: No bruising Psychiatric: No history of depression or anxiety    Physical Exam:  Vital signs in last 24 hours: Temp:  [98.1 F (36.7 C)] 98.1 F (36.7 C) (03/17 0902) Pulse Rate:  [70-72] 72 (03/17 0900) Resp:  [14] 14 (03/17 0850) BP: (114-116)/(60-61) 116/61 (03/17 0900) SpO2:  [99 %-100 %] 100 % (03/17 0902) Weight:  [68 kg] 68 kg (03/17 0903)   General:   Pleasant  [place] male appears to be in NAD, Well developed, Well nourished, alert and cooperative Head:  Normocephalic and atraumatic. Eyes:   PEERL, EOMI. No icterus. Conjunctiva pink. Ears:  Normal auditory acuity. Neck:  Supple Throat: Oral cavity and pharynx without inflammation, swelling or lesion.  Lungs: Respirations even and unlabored. Lungs clear to auscultation bilaterally.   No wheezes, crackles, or rhonchi.  Heart: Normal S1, S2. No MRG. Regular rate and rhythm. No peripheral edema, cyanosis or pallor.  Abdomen:  Soft, nondistended, nontender. No rebound or guarding. Normal bowel sounds. No appreciable masses or hepatomegaly. Rectal: Performed by ER physician-no abnormality noted, guaiac pending Msk:  Symmetrical without gross deformities. Peripheral pulses intact.  Extremities:  Without edema, no deformity or joint abnormality.  Neurologic:  Alert and  oriented x4;  grossly normal neurologically.  Skin:   Dry and intact without significant lesions or rashes. Psychiatric: Demonstrates good judgement and reason without abnormal affect or behaviors.   LAB RESULTS: Recent Labs    02/25/20 0955  WBC 11.1*  HGB 7.7*  HCT 24.2*  PLT 211   BMET Recent Labs    02/25/20 0955  NA 140  K 4.2  CL 110  CO2 27  GLUCOSE 112*  BUN 25*  CREATININE 0.88  CALCIUM 8.2*   PREVIOUS ENDOSCOPIES:            No previous colonoscopy   Impression / Plan:   Impression: 1.  Rectal bleeding:for 5 days, 2-3x a day with BM, no documented colonoscopy, no abdominal or rectal pain; Consider hemorrhoids vs colon cancer vs polyp vs other 2.  Acute blood loss anemia: Hemoglobin 7.7 (previously 14.2 on 10/19/2015)  Plan: 1. Agree with 1 unit PRBC transfusion 2. Continue to monitor hgb with transfusion as needed <7 3. Will schedule patient for colonoscopy tomorrow with Dr. Hilarie Fredrickson. Did discuss risks, benefits, limitations and alternatives and the patient agrees to proceed. COVID test has been done and  is pending. 4. Start Movi-prep this afternoon in split dose fashion 5. Clear liquid diet today and NPO after midnight. 6. Continue other supportive measures 7. Please await any final recommendations from Dr. Hilarie Fredrickson later today  Thank you for your kind consultation, we will continue to follow.  Lavone Nian [facility]  02/25/2020, 11:21 AM

## 2020-02-25 NOTE — H&P (View-Only) (Signed)
Consultation  Referring Provider:   Dr. Wynelle Cleveland Primary Care Physician:  Tresa Garter, MD Primary Gastroenterologist:   Althia Forts      Reason for Consultation:   Rectal bleeding         HPI:   Jamie Short is a 69 y.o. [place] male with a past medical history as listed below including BPH and hemorrhoids, who presented to the ED today after 5 days of rectal bleeding.    Today, patient's son acts as a Optometrist and explains that the patient has had rectal bleeding which started 5 days ago with loose stools and bright red blood about 2-3 times a day, the last one this morning.  Apparently tried treating this with over-the-counter hemorrhoid cream which has not helped at all.  Describes previous issues with hemorrhoids, just one time which resolved with OTC hemorrhoid cream.  This morning patient reports feeling significantly weak with lightheadedness and dizziness upon standing.  They decided it was time to come to the ER.    Denies fever, chills, weight loss, change in bowel habits, SOB or abdominal pain.  ER course: Hemoglobin 7.7 (previously 14.2 on 10/19/2015), white blood cell count elevated 11.1, BUN elevated at 25  Past Medical History:  Diagnosis Date  . Arthritis    lower legs  . BPH (benign prostatic hyperplasia)   . BPH (benign prostatic hyperplasia)   . Foley catheter in place   . Hemorrhoids   . History of gastric ulcer   . Urinary retention   . Wears dentures   . Wears glasses     Past Surgical History:  Procedure Laterality Date  . LAPAROSCOPIC CHOLECYSTECTOMY  2006 approx  . TRANSURETHRAL RESECTION OF PROSTATE N/A 10/18/2015   Procedure: TRANSURETHRAL RESECTION OF THE PROSTATE WITH GYRUS INSTRUMENTS WITH RESECTION OF BLADDER WALL TUMOR;  Surgeon: Cleon Gustin, MD;  Location: [facility];  Service: Urology;  Laterality: N/A;    History reviewed. No pertinent family history.  Social History   Tobacco Use  . Smoking status:  Current Every Day Smoker    Packs/day: 0.50    Years: 40.00    Pack years: 20.00    Types: Cigarettes  . Smokeless tobacco: Never Used  Substance Use Topics  . Alcohol use: No  . Drug use: No    Prior to Admission medications   Medication Sig Start Date End Date Taking? Authorizing Provider  acetaminophen (TYLENOL) 325 MG tablet Take 650 mg by mouth every 6 (six) hours as needed for mild pain or headache.   Yes [provider]  gabapentin (NEURONTIN) 300 MG capsule Take 300 mg by mouth 3 (three) times daily. 01/27/20  Yes [provider]  Glucosamine HCl (GLUCOSAMINE PO) Take 1 tablet by mouth 2 (two) times daily.   Yes [provider]  hydrocortisone (ANUSOL-HC) 2.5 % rectal cream Place 1 application rectally 4 (four) times daily as needed for hemorrhoids or anal itching.   Yes [provider]  lovastatin (MEVACOR) 20 MG tablet Take 20 mg by mouth at bedtime. 01/27/20  Yes [provider]  meloxicam (MOBIC) 15 MG tablet Take 7.5-15 mg by mouth daily as needed for muscle spasms. 01/27/20  Yes [provider]  oxyCODONE-acetaminophen (PERCOCET/ROXICET) 5-325 MG tablet Take 1-2 tablets by mouth every 4 (four) hours as needed for moderate pain. Patient not taking: Reported on 02/25/2020 10/20/15   Cleon Gustin, MD  sennosides (SENOKOT) 8.8 MG/5ML syrup Take 5 mLs by mouth  once. Patient not taking: Reported on 02/25/2020 10/20/15   Cleon Gustin, MD    Current Facility-Administered Medications  Medication Dose Route Frequency Provider Last Rate Last Admin  . 0.9 %  sodium chloride infusion (Manually program via Guardrails IV Fluids)   Intravenous Once Matilde Haymaker, MD      . 0.9 %  sodium chloride infusion   Intravenous Once Matilde Haymaker, MD      . acetaminophen (TYLENOL) tablet 650 mg  650 mg Oral Q6H PRN Debbe Odea, MD       Or  . acetaminophen (TYLENOL) suppository 650 mg  650 mg Rectal Q6H PRN Debbe Odea, MD      .  ondansetron (ZOFRAN) tablet 4 mg  4 mg Oral Q6H PRN Debbe Odea, MD       Or  . ondansetron (ZOFRAN) injection 4 mg  4 mg Intravenous Q6H PRN Debbe Odea, MD       Current Outpatient Medications  Medication Sig Dispense Refill  . acetaminophen (TYLENOL) 325 MG tablet Take 650 mg by mouth every 6 (six) hours as needed for mild pain or headache.    . gabapentin (NEURONTIN) 300 MG capsule Take 300 mg by mouth 3 (three) times daily.    . Glucosamine HCl (GLUCOSAMINE PO) Take 1 tablet by mouth 2 (two) times daily.    . hydrocortisone (ANUSOL-HC) 2.5 % rectal cream Place 1 application rectally 4 (four) times daily as needed for hemorrhoids or anal itching.    . lovastatin (MEVACOR) 20 MG tablet Take 20 mg by mouth at bedtime.    . meloxicam (MOBIC) 15 MG tablet Take 7.5-15 mg by mouth daily as needed for muscle spasms.    Marland Kitchen oxyCODONE-acetaminophen (PERCOCET/ROXICET) 5-325 MG tablet Take 1-2 tablets by mouth every 4 (four) hours as needed for moderate pain. (Patient not taking: Reported on 02/25/2020) 30 tablet 0  . sennosides (SENOKOT) 8.8 MG/5ML syrup Take 5 mLs by mouth once. (Patient not taking: Reported on 02/25/2020) 240 mL 0    Allergies as of 02/25/2020  . (No Known Allergies)     Review of Systems:    Constitutional: No weight loss, fever or chills Skin: No rash  Cardiovascular: No chest pain Respiratory: No SOB Gastrointestinal: See HPI and otherwise negative Genitourinary: No dysuria or change in urinary frequency Neurological: +dizziness Musculoskeletal: No new muscle or joint pain Hematologic: No bruising Psychiatric: No history of depression or anxiety    Physical Exam:  Vital signs in last 24 hours: Temp:  [98.1 F (36.7 C)] 98.1 F (36.7 C) (03/17 0902) Pulse Rate:  [70-72] 72 (03/17 0900) Resp:  [14] 14 (03/17 0850) BP: (114-116)/(60-61) 116/61 (03/17 0900) SpO2:  [99 %-100 %] 100 % (03/17 0902) Weight:  [68 kg] 68 kg (03/17 0903)   General:   Pleasant  [place] male appears to be in NAD, Well developed, Well nourished, alert and cooperative Head:  Normocephalic and atraumatic. Eyes:   PEERL, EOMI. No icterus. Conjunctiva pink. Ears:  Normal auditory acuity. Neck:  Supple Throat: Oral cavity and pharynx without inflammation, swelling or lesion.  Lungs: Respirations even and unlabored. Lungs clear to auscultation bilaterally.   No wheezes, crackles, or rhonchi.  Heart: Normal S1, S2. No MRG. Regular rate and rhythm. No peripheral edema, cyanosis or pallor.  Abdomen:  Soft, nondistended, nontender. No rebound or guarding. Normal bowel sounds. No appreciable masses or hepatomegaly. Rectal: Performed by ER physician-no abnormality noted, guaiac pending Msk:  Symmetrical without gross deformities. Peripheral pulses intact.  Extremities:  Without edema, no deformity or joint abnormality.  Neurologic:  Alert and  oriented x4;  grossly normal neurologically.  Skin:   Dry and intact without significant lesions or rashes. Psychiatric: Demonstrates good judgement and reason without abnormal affect or behaviors.   LAB RESULTS: Recent Labs    02/25/20 0955  WBC 11.1*  HGB 7.7*  HCT 24.2*  PLT 211   BMET Recent Labs    02/25/20 0955  NA 140  K 4.2  CL 110  CO2 27  GLUCOSE 112*  BUN 25*  CREATININE 0.88  CALCIUM 8.2*   PREVIOUS ENDOSCOPIES:            No previous colonoscopy   Impression / Plan:   Impression: 1.  Rectal bleeding:for 5 days, 2-3x a day with BM, no documented colonoscopy, no abdominal or rectal pain; Consider hemorrhoids vs colon cancer vs polyp vs other 2.  Acute blood loss anemia: Hemoglobin 7.7 (previously 14.2 on 10/19/2015)  Plan: 1. Agree with 1 unit PRBC transfusion 2. Continue to monitor hgb with transfusion as needed <7 3. Will schedule patient for colonoscopy tomorrow with Dr. Hilarie Fredrickson. Did discuss risks, benefits, limitations and alternatives and the patient agrees to proceed. COVID test has been done and  is pending. 4. Start Movi-prep this afternoon in split dose fashion 5. Clear liquid diet today and NPO after midnight. 6. Continue other supportive measures 7. Please await any final recommendations from Dr. Hilarie Fredrickson later today  Thank you for your kind consultation, we will continue to follow.  Lavone Nian [facility]  02/25/2020, 11:21 AM

## 2020-02-25 NOTE — Progress Notes (Signed)
Spoke with Lang Snow NP about patient's repeat Hgb 8.1; trending up. 1unit PRBC ordered. Patient's blood pressure currently within normal limits. Verbal order to hold off on transfusion. Repeat CBC ordered q6. Will reassess need after repeat labs.

## 2020-02-25 NOTE — ED Provider Notes (Signed)
[facility] DEPT Provider Note   CSN: [id number] Arrival date & time: 02/25/20  [id number]     History Chief Complaint  Patient presents with  . Rectal Bleeding    Jamie Short Jamie Short is a 69 y.o. male.  Jamie Short is a 69 year old man who presents to the ED with 5 days of rectal bleeding.  He has a previous medical history significant for hemorrhoids previous episodes of bright red blood per rectum.  His son acted as Optometrist for our encounter based on patient preference.  He reports that his rectal bleeding started 5 days ago and he has been having loose stools with bright red blood about 2-3 times per day.  They tried treating this bleeding with over-the-counter hemorrhoid cream which did not lead to any improvement.  He has previously had issues with hemorrhoids she is why they initially tried to treat this issue at home.  This morning, Jamie Short felt significantly decreased energy and also lightheadedness/dizziness upon standing.  With his new information, they decided it was time to come to the emergency room for a formal evaluation.  He does not take any blood thinners.  He is never had a colonoscopy.        Past Medical History:  Diagnosis Date  . Arthritis    lower legs  . BPH (benign prostatic hyperplasia)   . BPH (benign prostatic hyperplasia)   . Foley catheter in place   . Hemorrhoids   . History of gastric ulcer   . Urinary retention   . Wears dentures   . Wears glasses     Patient Active Problem List   Diagnosis Date Noted  . BPH (benign prostatic hyperplasia) 10/18/2015  . Colon cancer screening 10/08/2014  . Back pain of thoracolumbar region 09/10/2013  . BPH (benign prostatic hypertrophy) 09/10/2013    Past Surgical History:  Procedure Laterality Date  . LAPAROSCOPIC CHOLECYSTECTOMY  2006 approx  . 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;  Service: Urology;  Laterality: N/A;       History reviewed. No pertinent family history.  Social History   Tobacco Use  . Smoking status: Current Every Day Smoker    Packs/day: 0.50    Years: 40.00    Pack years: 20.00    Types: Cigarettes  . Smokeless tobacco: Never Used  Substance Use Topics  . Alcohol use: No  . Drug use: No    Home Medications Prior to Admission medications   Medication Sig Start Date End Date Taking? Authorizing Provider  acetaminophen-codeine (TYLENOL #3) 300-30 MG tablet Take 1-2 tablets by mouth every 4 (four) hours as needed. 09/01/15   [provider]  oxyCODONE-acetaminophen (PERCOCET/ROXICET) 5-325 MG tablet Take 1-2 tablets by mouth every 4 (four) hours as needed for moderate pain. 10/20/15   McKenzie, Candee Furbish, MD  Phenazopyridine HCl (AZO-STANDARD PO) Take by mouth 2 (two) times daily.    [provider]  sennosides (SENOKOT) 8.8 MG/5ML syrup Take 5 mLs by mouth once. 10/20/15   McKenzie, Candee Furbish, MD  silodosin (RAPAFLO) 4 MG CAPS capsule Take 4 mg by mouth daily with breakfast.    [provider]  solifenacin (VESICARE) 5 MG tablet Take 5 mg by mouth every evening.     [provider]  traMADol (ULTRAM) 50 MG tablet Take 50  mg by mouth every 6 (six) hours as needed.    [provider]    Allergies    Patient has no known allergies.  Review of Systems   Review of Systems  Constitutional: Positive for fatigue. Negative for fever.  HENT: Negative for congestion and sore throat.   Eyes: Negative for visual disturbance.  Respiratory: Negative for cough, shortness of breath and wheezing.   Cardiovascular: Negative for chest pain and palpitations.  Gastrointestinal: Positive for anal bleeding and blood in stool. Negative for abdominal distention, abdominal pain, diarrhea, nausea, rectal pain and vomiting.    Genitourinary: Negative for difficulty urinating and dysuria.  Musculoskeletal: Negative for arthralgias and myalgias.  Neurological: Positive for dizziness. Negative for weakness and headaches.    Physical Exam Updated Vital Signs BP 116/61   Pulse 72   Temp 98.1 F (36.7 C) (Oral)   Resp 14   Ht 5\' 4"  (1.626 m)   Wt 68 kg   SpO2 100%   BMI 25.75 kg/m   Physical Exam Constitutional:      General: He is not in acute distress.    Appearance: Normal appearance. He is not ill-appearing.  HENT:     Head: Normocephalic and atraumatic.     Nose: Nose normal.  Eyes:     Extraocular Movements: Extraocular movements intact.     Pupils: Pupils are equal, round, and reactive to light.     Comments: Pale conjunctiva   Cardiovascular:     Rate and Rhythm: Normal rate and regular rhythm.     Pulses: Normal pulses.     Heart sounds: Normal heart sounds. No murmur.  Pulmonary:     Effort: Pulmonary effort is normal. No respiratory distress.     Breath sounds: Normal breath sounds. No wheezing or rales.  Abdominal:     General: Abdomen is flat. Bowel sounds are normal. There is no distension.     Palpations: Abdomen is soft.     Tenderness: There is no abdominal tenderness. There is no guarding.  Genitourinary:    Rectum: Normal.     Comments: Guaiac pending Musculoskeletal:        General: Normal range of motion.     Cervical back: Normal range of motion and neck supple.  Skin:    General: Skin is warm and dry.     Capillary Refill: Capillary refill takes 2 to 3 seconds.     Comments: Red palmar creases.   Neurological:     General: No focal deficit present.     Mental Status: He is alert and oriented to person, place, and time. Mental status is at baseline.  Psychiatric:        Mood and Affect: Mood normal.        Behavior: Behavior normal.     ED Results / Procedures / Treatments   Labs (all labs ordered are listed, but only abnormal results are displayed) Labs  Reviewed  BASIC METABOLIC PANEL  CBC WITH DIFFERENTIAL/PLATELET  TYPE AND SCREEN    EKG None  Radiology No results found.  Procedures Procedures (including critical care time)  Medications Ordered in ED Medications - No data to display  ED Course  I have reviewed the triage vital signs and the nursing notes.  Pertinent labs & imaging results that were available during my care of the patient were reviewed by me and considered in my medical decision making (see chart for details).    MDM Rules/Calculators/A&P  Mr. Krupa is a 69 year old gentleman presenting to the ED with 5 days of painless rectal bleeding.  Stable vitals at this time.  Low suspicion for GI bleed based on bright red blood per rectum and lack of abdominal pain.  Hemorrhoidal bleed most likely based on past medical history.  Diverticular bleed also possible.  No previous colonoscopy on file, patient denies any previous colonoscopy.  We will draw CBC and BMP, type and screen.  Follow-up orthostatic vitals to assess for orthostatic hypotension.  Fecal occult blood positive.  Hemoglobin 7.7.  Mr. Ismaili is close to transfusion threshold and symptomatic.  We will plan for transfusion in the ED.  Discussed with patient.  GI consult called.  Hospitalist contacted for admission.  Final Clinical Impression(s) / ED Diagnoses Final diagnoses:  None    Rx / DC Orders ED Discharge Orders    None       Matilde Haymaker, MD 02/25/20 1741    Carmin Muskrat, MD 02/27/20 [phone]

## 2020-02-25 NOTE — ED Notes (Signed)
Electronic consent for blood obtained and witnessed by this RN.

## 2020-02-26 ENCOUNTER — Inpatient Hospital Stay (HOSPITAL_COMMUNITY): Payer: Medicare Other | Admitting: Certified Registered Nurse Anesthetist

## 2020-02-26 ENCOUNTER — Encounter (HOSPITAL_COMMUNITY): Payer: Self-pay | Admitting: Internal Medicine

## 2020-02-26 ENCOUNTER — Encounter (HOSPITAL_COMMUNITY)
Admission: EM | Disposition: A | Discharge status: Home / Self Care | Payer: Self-pay | Source: Home / Self Care | Attending: Internal Medicine

## 2020-02-26 DIAGNOSIS — K649 Unspecified hemorrhoids: Secondary | ICD-10-CM

## 2020-02-26 DIAGNOSIS — K921 Melena: Secondary | ICD-10-CM

## 2020-02-26 DIAGNOSIS — I951 Orthostatic hypotension: Secondary | ICD-10-CM

## 2020-02-26 HISTORY — PX: COLONOSCOPY WITH PROPOFOL: SHX5780

## 2020-02-26 LAB — BASIC METABOLIC PANEL
Anion gap: 6 (ref 5–15)
BUN: 16 mg/dL (ref 8–23)
CO2: 21 mmol/L — ABNORMAL LOW (ref 22–32)
Calcium: 7.9 mg/dL — ABNORMAL LOW (ref 8.9–10.3)
Chloride: 114 mmol/L — ABNORMAL HIGH (ref 98–111)
Creatinine, Ser: 0.72 mg/dL (ref 0.61–1.24)
GFR calc Af Amer: >60 mL/min (ref >60)
GFR calc non Af Amer: >60 mL/min (ref >60)
Glucose, Bld: 91 mg/dL (ref 70–99)
Potassium: 4.1 mmol/L (ref 3.5–5.1)
Sodium: 141 mmol/L (ref 135–145)

## 2020-02-26 LAB — CBC
HCT: 24.8 % — ABNORMAL LOW (ref 39.0–52.0)
Hemoglobin: 7.6 g/dL — ABNORMAL LOW (ref 13.0–17.0)
MCH: 31.4 pg (ref 26.0–34.0)
MCHC: 30.6 g/dL (ref 30.0–36.0)
MCV: 102.5 fL — ABNORMAL HIGH (ref 80.0–100.0)
Platelets: 237 10*3/uL (ref 150–400)
RBC: 2.42 MIL/uL — ABNORMAL LOW (ref 4.22–5.81)
RDW: 14.5 % (ref 11.5–15.5)
WBC: 11 10*3/uL — ABNORMAL HIGH (ref 4.0–10.5)
nRBC: 0 % (ref 0.0–0.2)

## 2020-02-26 LAB — PREPARE RBC (CROSSMATCH)

## 2020-02-26 LAB — PROTIME-INR
INR: 1.1 (ref 0.8–1.2)
Prothrombin Time: 14.1 seconds (ref 11.4–15.2)

## 2020-02-26 SURGERY — COLONOSCOPY WITH PROPOFOL
Anesthesia: Monitor Anesthesia Care

## 2020-02-26 MED ORDER — SODIUM CHLORIDE 0.9 % IV SOLN: INTRAVENOUS | Status: DC

## 2020-02-26 MED ORDER — LIDOCAINE 2% (20 MG/ML) 5 ML SYRINGE
INTRAMUSCULAR | Status: DC | PRN
  Administered 2020-02-26: 60 mg via INTRAVENOUS

## 2020-02-26 MED ORDER — PROPOFOL 500 MG/50ML IV EMUL
INTRAVENOUS | Status: DC | PRN
  Administered 2020-02-26: 75 ug/kg/min via INTRAVENOUS

## 2020-02-26 MED ORDER — PROPOFOL 10 MG/ML IV BOLUS
INTRAVENOUS | Status: DC | PRN
  Administered 2020-02-26 (×2): 10 mg via INTRAVENOUS
  Administered 2020-02-26: 20 mg via INTRAVENOUS
  Administered 2020-02-26: 10 mg via INTRAVENOUS
  Administered 2020-02-26: 20 mg via INTRAVENOUS

## 2020-02-26 MED ORDER — PROPOFOL 10 MG/ML IV BOLUS
INTRAVENOUS | Status: AC
  Filled 2020-02-26: qty 20

## 2020-02-26 MED ORDER — PROPOFOL 500 MG/50ML IV EMUL
INTRAVENOUS | Status: AC
  Filled 2020-02-26: qty 50

## 2020-02-26 MED ORDER — SODIUM CHLORIDE 0.9% IV SOLUTION: Freq: Once | INTRAVENOUS | Status: DC

## 2020-02-26 MED ORDER — LACTATED RINGERS IV SOLN: INTRAVENOUS | Status: DC | PRN

## 2020-02-26 SURGICAL SUPPLY — 22 items

## 2020-02-26 NOTE — Op Note (Signed)
[facility] Patient Name: Jamie Short Procedure Date: 02/26/2020 MRN: [id number] Attending MD: Jerene Bears , MD Date of Birth: [birth year] CSN: [id number] Age: 69 Admit Type: Inpatient Procedure:                Colonoscopy Indications:              Hematochezia Providers:                Lajuan Lines. Hilarie Fredrickson, MD, Glori Bickers, RN, Lina Sar,                            Technician, Adair Laundry, CRNA Referring MD:             [facility] Medicines:                Monitored Anesthesia Care Complications:            No immediate complications. Estimated Blood Loss:     Estimated blood loss: none. Procedure:                Pre-Anesthesia Assessment:                           - Prior to the procedure, a History and Physical                            was performed, and patient medications and                            allergies were reviewed. The patient's tolerance of                            previous anesthesia was also reviewed. The risks                            and benefits of the procedure and the sedation                            options and risks were discussed with the patient.                            All questions were answered, and informed consent                            was obtained. Prior Anticoagulants: The patient has                            taken no previous anticoagulant or antiplatelet                            agents. ASA Grade Assessment: II - A patient with                            mild systemic disease. After reviewing the risks  and benefits, the patient was deemed in                            satisfactory condition to undergo the procedure.                           After obtaining informed consent, the colonoscope                            was passed under direct vision. Throughout the                            procedure, the patient's blood pressure, pulse, and                            oxygen  saturations were monitored continuously. The                            [id number] Olympus colonoscope was                            introduced through the anus and advanced to the                            cecum, identified by the appendiceal orifice. The                            colonoscopy was performed without difficulty. The                            patient tolerated the procedure well. The quality                            of the bowel preparation was good. The ileocecal                            valve, appendiceal orifice, and rectum were                            photographed. Scope In: 11:26:00 AM Scope Out: 11:38:26 AM Scope Withdrawal Time: 0 hours 8 minutes 18 seconds  Total Procedure Duration: 0 hours 12 minutes 26 seconds  Findings:      The digital rectal exam was normal.      Multiple small-mouthed diverticula were found from hepatic flexure to       sigmoid colon. No evidence of active or immediately recent bleeding       during today's examination.      Internal hemorrhoids were found during retroflexion. Impression:               - Moderate diverticulosis from hepatic flexure to                            sigmoid colon. Most likely source of recent  hematochezia, now resolved.                           - Internal hemorrhoids.                           - No specimens collected. Moderate Sedation:      N/A Recommendation:           - Patient has a contact number available for                            emergencies. The signs and symptoms of potential                            delayed complications were discussed with the                            patient. Return to normal activities tomorrow.                            Written discharge instructions were provided to the                            patient.                           - Resume regular diet.                           - Continue present medications.                            - Monitor response to transfusion and for recurrent                            hematochezia. If this occurs, then would proceed to                            tagged RBC study versus CT-angio abd/pelvis                            followed by IR consult if positive.                           - No repeat colonoscopy will be beneficial for                            screening due to age and the absence of colonic                            polyps.                           - If hemorrhoids are symptomatic, such as minor  rectal bleeding (hemorrhoids alone are not solely                            responsible for hematochezia and resultant anemia)                            then outpatient hemorrhoidal banding would be an                            effective treatment.                           - GI will remain available, call with questions. Procedure Code(s):        --- Professional ---                           [phone], Colonoscopy, flexible; diagnostic, including                            collection of specimen(s) by brushing or washing,                            when performed (separate procedure) Diagnosis Code(s):        --- Professional ---                           K64.8, Other hemorrhoids                           K92.1, Melena (includes Hematochezia)                           K57.30, Diverticulosis of large intestine without                            perforation or abscess without bleeding CPT copyright 2019 American Medical Association. All rights reserved. The codes documented in this report are preliminary and upon coder review may  be revised to meet current compliance requirements. Jerene Bears, MD 02/26/2020 11:47:26 AM This report has been signed electronically. Number of Addenda: 0

## 2020-02-26 NOTE — Progress Notes (Signed)
PROGRESS NOTE  Jamie Short [id number] DOB: 1951-04-27 DOA: 02/25/2020 PCP: Tresa Garter, MD   LOS: 1 day   Brief narrative: As per HPI,  Jamie Short is a 69 y.o. male with medical history of BPH presented to the hospital from home with complaints of bright red blood per rectum for 5 days which was painless.  Patient does have history of hemorrhoids in the past and uses Anusol.  Despite that he continued to have bleeding so he decided come to the hospital.  He also had some lightheadedness.  Denies history of peptic ulcer disease but has been taking Mobic for arthritis..ED Course: Hemoglobin 7.5, MCV 100.4, BUN 25 with a creatinine of 0.88. Orthostatic vitals with positive. Fecal occult blood was positive  Assessment/Plan:  Principal Problem:   GI bleed Active Problems:   Anemia associated with acute blood loss   Orthostatic hypotension   Hemorrhoids   Rectal bleeding    GI bleed with orthostatic hypotension/ dehydration We will transfuse 1 unit of packed RBC due to orthostatic hypertension and symptomatic anemia.  GI has been consulted.  Planning for colonoscopy today.    Anemia associated with acute blood loss  Will transfuse 1 unit of packed RBC today.  Secondary to GI bleed.  Current hemodynamically stable.  Arthritis and back pain  hold Mobic- cont Neurontin, Glucosamine and Oxycodone  BPH Not on medications.   VTE Prophylaxis: SCD  Code Status: Full code  Family Communication: None today.  Disposition Plan:  . Patient is from home . Likely disposition to home likely in 1 to 2 days . Barriers to discharge: Plan for colonoscopy  Consultants:  GI  Procedures:  PRBC transfusion  Antibiotics:  . None  Anti-infectives (From admission, onward)   None     Subjective: Today, patient was seen and examined at bedside.  Patient denies any bleeding since 10 PM yesterday.  Denies any abdominal pain.  Denies dizziness or any chest pain, shortness of  breath.  History taken with the help of video interpreter due to language barrier.  Objective: Vitals:   02/26/20 0827 02/26/20 0913  BP: 127/75 132/65  Pulse:  77  Resp:    Temp: 98.2 F (36.8 C) 98.2 F (36.8 C)  SpO2: 98% 98%    Intake/Output Summary (Last 24 hours) at 02/26/2020 0947 Last data filed at 02/26/2020 0629 Gross per 24 hour  Intake --  Output 100 ml  Net -100 ml   Filed Weights   02/25/20 0903  Weight: 68 kg   Body mass index is 25.75 kg/m.   Physical Exam: GENERAL: Patient is alert awake and oriented. Not in obvious distress.  Thinly built HENT: Mild pallor noted.  Pupils equally reactive to light. Oral mucosa is moist NECK: is supple, no gross swelling noted. CHEST: Clear to auscultation. No crackles or wheezes.  Diminished breath sounds bilaterally. CVS: S1 and S2 heard, no murmur. Regular rate and rhythm.  ABDOMEN: Soft, non-tender, bowel sounds are present. EXTREMITIES: No edema. CNS: Cranial nerves are intact. No focal motor deficits. SKIN: warm and dry without rashes.  Data Review: I have personally reviewed the following laboratory data and studies,  CBC: Recent Labs  Lab 02/25/20 0955 02/25/20 1250 02/25/20 1740 02/25/20 2252 02/26/20 0520  WBC 11.1* 11.8* 12.5* 12.0* 11.0*  NEUTROABS 8.4*  --   --   --   --   HGB 7.7* 7.5* 8.1* 7.7* 7.6*  HCT 24.2* 24.3* 25.7* 24.1* 24.8*  MCV 100.0 100.4*  100.4* 100.0 102.5*  PLT 211 225 246 228 [id number]   Basic Metabolic Panel: Recent Labs  Lab 02/25/20 0955 02/26/20 0520  NA 140 141  K 4.2 4.1  CL 110 114*  CO2 27 21*  GLUCOSE 112* 91  BUN 25* 16  CREATININE 0.88 0.72  CALCIUM 8.2* 7.9*   Liver Function Tests: No results for input(s): AST, ALT, ALKPHOS, BILITOT, PROT, ALBUMIN in the last 168 hours. No results for input(s): LIPASE, AMYLASE in the last 168 hours. No results for input(s): AMMONIA in the last 168 hours. Cardiac Enzymes: No results for input(s): CKTOTAL, CKMB, CKMBINDEX,  TROPONINI in the last 168 hours. BNP (last 3 results) No results for input(s): BNP in the last 8760 hours.  ProBNP (last 3 results) No results for input(s): PROBNP in the last 8760 hours.  CBG: No results for input(s): GLUCAP in the last 168 hours. Recent Results (from the past 240 hour(s))  SARS CORONAVIRUS 2 (TAT 6-24 HRS) Nasopharyngeal Nasopharyngeal Swab     Status: None   Collection Time: 02/25/20 12:50 PM   Specimen: Nasopharyngeal Swab  Result Value Ref Range Status   SARS Coronavirus 2 NEGATIVE NEGATIVE Final    Comment: (NOTE) SARS-CoV-2 target nucleic acids are NOT DETECTED. The SARS-CoV-2 RNA is generally detectable in upper and lower respiratory specimens during the acute phase of infection. Negative results do not preclude SARS-CoV-2 infection, do not rule out co-infections with other pathogens, and should not be used as the sole basis for treatment or other patient management decisions. Negative results must be combined with clinical observations, patient history, and epidemiological information. The expected result is Negative. Fact Sheet for Patients: SugarRoll.be Fact Sheet for Healthcare Providers: https://www.woods-mathews.com/ This test is not yet approved or cleared by the [place] FDA and  has been authorized for detection and/or diagnosis of SARS-CoV-2 by FDA under an Emergency Use Authorization (EUA). This EUA will remain  in effect (meaning this test can be used) for the duration of the COVID-19 declaration under Section 56 4(b)(1) of the Act, 21 U.S.C. section 360bbb-3(b)(1), unless the authorization is terminated or revoked sooner. Performed at [facility], [place] [address] [place], [place] [address]      Studies: No results found.   Flora Lipps, MD  [facility] 02/26/2020

## 2020-02-26 NOTE — Anesthesia Postprocedure Evaluation (Signed)
Anesthesia Post Note  Patient: Jamie Short  Procedure(s) Performed: COLONOSCOPY WITH PROPOFOL (N/A )     Patient location during evaluation: Endoscopy Anesthesia Type: MAC Level of consciousness: awake Pain management: pain level controlled Vital Signs Assessment: post-procedure vital signs reviewed and stable Respiratory status: spontaneous breathing, nonlabored ventilation, respiratory function stable and patient connected to nasal cannula oxygen Cardiovascular status: stable and blood pressure returned to baseline Postop Assessment: no apparent nausea or vomiting Anesthetic complications: no    Last Vitals:  Vitals:   02/26/20 1210 02/26/20 1239  BP: (!) 167/71 (!) 145/73  Pulse: 69 64  Resp: 13 18  Temp:  (!) 36.3 C  SpO2: 100% 100%    Last Pain:  Vitals:   02/26/20 1239  TempSrc: Oral  PainSc:                  Karyl Kinnier Soloman Mckeithan

## 2020-02-26 NOTE — Progress Notes (Signed)
Patient sent to endo with unit of blood hanging (RN aware).  Transfusion finished downstairs.

## 2020-02-26 NOTE — Progress Notes (Signed)
Consent signed with interpretor # [id number].  All questions answered.

## 2020-02-26 NOTE — Transfer of Care (Signed)
Immediate Anesthesia Transfer of Care Note  Patient: Jamie Short  Procedure(s) Performed: COLONOSCOPY WITH PROPOFOL (N/A )  Patient Location: Endoscopy Unit  Anesthesia Type:MAC  Level of Consciousness: drowsy and patient cooperative  Airway & Oxygen Therapy: Patient Spontanous Breathing and Patient connected to face mask oxygen  Post-op Assessment: Report given to RN and Post -op Vital signs reviewed and stable  Post vital signs: Reviewed and stable  Last Vitals:  Vitals Value Taken Time  BP 120/68 02/26/20 1148  Temp    Pulse 80 02/26/20 1150  Resp 15 02/26/20 1150  SpO2 100 % 02/26/20 1150  Vitals shown include unvalidated device data.  Last Pain:  Vitals:   02/26/20 1017  TempSrc: Oral  PainSc: 0-No pain         Complications: No apparent anesthesia complications

## 2020-02-26 NOTE — Progress Notes (Signed)
Translator called to go over blood transfusion with patient.  Educated patient about transfusion.  All questions answered.

## 2020-02-26 NOTE — Anesthesia Preprocedure Evaluation (Addendum)
Anesthesia Evaluation  Patient identified by MRN, date of birth, ID band Patient awake    Reviewed: Allergy & Precautions, NPO status , Patient's Chart, lab work & pertinent test results  Airway Mallampati: III  TM Distance: >3 FB Neck ROM: Full    Dental  (+) Poor Dentition, Missing, Edentulous Upper, Chipped   Pulmonary former smoker,    Pulmonary exam normal breath sounds clear to auscultation       Cardiovascular negative cardio ROS Normal cardiovascular exam Rhythm:Regular Rate:Normal     Neuro/Psych negative neurological ROS  negative psych ROS   GI/Hepatic Neg liver ROS, Bowel prep,  Endo/Other  negative endocrine ROS  Renal/GU negative Renal ROS     Musculoskeletal negative musculoskeletal ROS (+)   Abdominal   Peds  Hematology  (+) anemia , HLD   Anesthesia Other Findings Rectal Bleeding  Reproductive/Obstetrics                            Anesthesia Physical Anesthesia Plan  ASA: II  Anesthesia Plan: MAC   Post-op Pain Management:    Induction: Intravenous  PONV Risk Score and Plan: 0 and Propofol infusion and Treatment may vary due to age or medical condition  Airway Management Planned: Simple Face Mask  Additional Equipment:   Intra-op Plan:   Post-operative Plan:   Informed Consent: I have reviewed the patients History and Physical, chart, labs and discussed the procedure including the risks, benefits and alternatives for the proposed anesthesia with the patient or authorized representative who has indicated his/her understanding and acceptance.     Dental advisory given  Plan Discussed with: CRNA  Anesthesia Plan Comments: (Video interpretation services utilized )       Anesthesia Quick Evaluation

## 2020-02-26 NOTE — Interval H&P Note (Signed)
History and Physical Interval Note: For colonoscopy today to eval rectal bleeding. The nature of the procedure, as well as the risks, benefits, and alternatives were carefully and thoroughly reviewed with the patient. Ample time for discussion and questions allowed. The patient understood, was satisfied, and agreed to proceed.  Getting pRBC now.  CBC Latest Ref Rng & Units 02/26/2020 02/25/2020 02/25/2020  WBC 4.0 - 10.5 K/uL 11.0(H) 12.0(H) 12.5(H)  Hemoglobin 13.0 - 17.0 g/dL 7.6(L) 7.7(L) 8.1(L)  Hematocrit 39.0 - 52.0 % 24.8(L) 24.1(L) 25.7(L)  Platelets 150 - 400 K/uL 237 228 246     02/26/2020 11:04 AM  Jamie Short  has presented today for surgery, with the diagnosis of Rectal Bleeding.  The various methods of treatment have been discussed with the patient and family. After consideration of risks, benefits and other options for treatment, the patient has consented to  Procedure(s): COLONOSCOPY WITH PROPOFOL (N/A) as a surgical intervention.  The patient's history has been reviewed, patient examined, no change in status, stable for surgery.  I have reviewed the patient's chart and labs.  Questions were answered to the patient's satisfaction.     Jamie Short

## 2020-02-27 ENCOUNTER — Encounter: Payer: Self-pay | Admitting: *Deleted

## 2020-02-27 LAB — BASIC METABOLIC PANEL
Anion gap: 7 (ref 5–15)
BUN: 16 mg/dL (ref 8–23)
CO2: 23 mmol/L (ref 22–32)
Calcium: 8 mg/dL — ABNORMAL LOW (ref 8.9–10.3)
Chloride: 109 mmol/L (ref 98–111)
Creatinine, Ser: 0.86 mg/dL (ref 0.61–1.24)
GFR calc Af Amer: >60 mL/min (ref >60)
GFR calc non Af Amer: >60 mL/min (ref >60)
Glucose, Bld: 124 mg/dL — ABNORMAL HIGH (ref 70–99)
Potassium: 3.4 mmol/L — ABNORMAL LOW (ref 3.5–5.1)
Sodium: 139 mmol/L (ref 135–145)

## 2020-02-27 LAB — CBC
HCT: 27.1 % — ABNORMAL LOW (ref 39.0–52.0)
Hemoglobin: 8.9 g/dL — ABNORMAL LOW (ref 13.0–17.0)
MCH: 31.4 pg (ref 26.0–34.0)
MCHC: 32.8 g/dL (ref 30.0–36.0)
MCV: 95.8 fL (ref 80.0–100.0)
Platelets: 238 10*3/uL (ref 150–400)
RBC: 2.83 MIL/uL — ABNORMAL LOW (ref 4.22–5.81)
RDW: 16.5 % — ABNORMAL HIGH (ref 11.5–15.5)
WBC: 11.2 10*3/uL — ABNORMAL HIGH (ref 4.0–10.5)
nRBC: 0 % (ref 0.0–0.2)

## 2020-02-27 LAB — TYPE AND SCREEN
ABO/RH(D): B POS
Antibody Screen: NEGATIVE
Unit division: 0

## 2020-02-27 LAB — MAGNESIUM: Magnesium: 2 mg/dL (ref 1.7–2.4)

## 2020-02-27 LAB — BPAM RBC
Blood Product Expiration Date: 202104112359
ISSUE DATE / TIME: 202103180839
Unit Type and Rh: 7300

## 2020-02-27 LAB — PHOSPHORUS: Phosphorus: 2.9 mg/dL (ref 2.5–4.6)

## 2020-02-27 MED ORDER — HYDROCORTISONE (PERIANAL) 2.5 % EX CREA: 1.0000 "application " | TOPICAL_CREAM | Freq: Four times a day (QID) | CUTANEOUS | 2 refills | Status: DC | PRN

## 2020-02-27 MED ORDER — POLYETHYLENE GLYCOL 3350 17 G PO PACK: 17.0000 g | PACK | Freq: Every day | ORAL | 0 refills | Status: DC | PRN

## 2020-02-27 MED ORDER — SENNA 8.6 MG PO TABS: 1.0000 | ORAL_TABLET | Freq: Every day | ORAL | 0 refills | Status: DC

## 2020-02-27 NOTE — Care Management Important Message (Signed)
Important Message  Patient Details IM Letter given to Evette Cristal SW Case Manager to present to the Patient Name: Jamie Short MRN: [id number] Date of Birth: 1951-04-20   Medicare Important Message Given:  Yes     [name], [name] 02/27/2020, 11:33 AM

## 2020-02-27 NOTE — Discharge Summary (Signed)
Physician Discharge Summary  Jamie Short [id number] DOB: 1951/10/17 DOA: 02/25/2020  PCP: Tresa Garter, MD  Admit date: 02/25/2020 Discharge date: 02/27/2020  Admitted From: Home  Discharge disposition: home  Recommendations for Outpatient Follow-Up:   . Follow up with your primary care provider in one week.  . Check CBC, BMP in the next visit.  Discharge Diagnosis:   Principal Problem:   GI bleed Active Problems:   Anemia associated with acute blood loss   Orthostatic hypotension   Hemorrhoids   Rectal bleeding  Discharge Condition: Improved.  Diet recommendation: Low sodium, heart healthy.    Wound care: None.  Code status: Full.   History of Present Illness:   Jamie Short a 69 y.o.malewith medical history ofBPH presented to the hospital from home with complaints of bright red blood per rectum for 5 days which was painless.  Patient does have history of hemorrhoids in the past and uses Anusol.  Despite that he continued to have bleeding, so he decided come to the hospital.  He also had some lightheadedness.  Denies history of peptic ulcer disease but has been taking Mobic for arthritis..ED Course:Hemoglobin 7.5, MCV 100.4, BUN 25 with a creatinine of 0.88. Orthostatic vitals with positive. Fecal occult blood was positive.  Patient was then admitted to hospital for further evaluation and treatment.   Hospital Course:   Following conditions were addressed during hospitalization as listed below,  GI bleed with orthostatic hypotension/ dehydration. Resolved.  Patient was volume resuscitated.  He was also transfused 1 unit of packed RBC due to orthostatic hypertension and symptomatic anemia.    This improved.  GI was consulted for GI bleed.  Patient underwent colonoscopy on 02/26/2020 with findings of diverticulosis.  Patient was observed overnight after colonoscopy and he is hemoglobin remained stable.  He did not have any further rectal bleed.  Colonoscopy  revealed internal hemorrhoids as well.  We will continue Anusol on discharge.  He will be given laxatives.  I had a prolonged discussion with the patient's family regarding the need for avoiding constipation.  Acute blood loss anemia.  Resolved.  Secondary to GI bleed. Status post transfusion of 1 unit of packed RBC.  Patient remained hemodynamically stable.  Arthritis and back pain  Advised to discontinue Mobic-okay to take Tylenol.    BPH Not on medications.  Disposition.  At this time, patient is stable for disposition.  Spoke with the patient's son at bedside at length..  Medical Consultants:    GI  Procedures:    Colonoscopy on 02/26/2020 Transfusion of 1 unit of packed RBC.  Subjective:   Today, patient feels okay.  Denies any bowel movements.  No nausea vomiting or abdominal pain.  Denies any chest pain, shortness of breath, fever or chills.  Discharge Exam:   Vitals:   02/26/20 2029 02/27/20 0500  BP: 127/73 117/62  Pulse: 83 68  Resp: 16 17  Temp: 98.3 F (36.8 C) 97.7 F (36.5 C)  SpO2: 97% 96%   Vitals:   02/26/20 1210 02/26/20 1239 02/26/20 2029 02/27/20 0500  BP: (!) 167/71 (!) 145/73 127/73 117/62  Pulse: 69 64 83 68  Resp: 13 18 16 17   Temp:  (!) 97.4 F (36.3 C) 98.3 F (36.8 C) 97.7 F (36.5 C)  TempSrc:  Oral Oral Oral  SpO2: 100% 100% 97% 96%  Weight:      Height:       General: Alert awake, not in obvious distress, thinly built HENT:  pupils equally reacting to light, mild pallor noted.  Oral mucosa is moist.  Chest:  Clear breath sounds.  Diminished breath sounds bilaterally. No crackles or wheezes.  CVS: S1 &S2 heard. No murmur.  Regular rate and rhythm. Abdomen: Soft, nontender, nondistended.  Bowel sounds are heard.   Extremities: No cyanosis, clubbing or edema.  Peripheral pulses are palpable. Psych: Alert, awake and oriented, normal mood CNS:  No cranial nerve deficits.  Power equal in all extremities.   Skin: Warm and dry.  No  rashes noted.  The results of significant diagnostics from this hospitalization (including imaging, microbiology, ancillary and laboratory) are listed below for reference.     Diagnostic Studies:   No results found.   Labs:   Basic Metabolic Panel: Recent Labs  Lab 02/25/20 0955 02/25/20 0955 02/26/20 0520 02/27/20 0455  NA 140  --  141 139  K 4.2   < > 4.1 3.4*  CL 110  --  114* 109  CO2 27  --  21* 23  GLUCOSE 112*  --  91 124*  BUN 25*  --  16 16  CREATININE 0.88  --  0.72 0.86  CALCIUM 8.2*  --  7.9* 8.0*  MG  --   --   --  2.0  PHOS  --   --   --  2.9   < > = values in this interval not displayed.   GFR Estimated Creatinine Clearance: 66.9 mL/min (by C-G formula based on SCr of 0.86 mg/dL). Liver Function Tests: No results for input(s): AST, ALT, ALKPHOS, BILITOT, PROT, ALBUMIN in the last 168 hours. No results for input(s): LIPASE, AMYLASE in the last 168 hours. No results for input(s): AMMONIA in the last 168 hours. Coagulation profile Recent Labs  Lab 02/26/20 0520  INR 1.1    CBC: Recent Labs  Lab 02/25/20 0955 02/25/20 0955 02/25/20 1250 02/25/20 1740 02/25/20 2252 02/26/20 0520 02/27/20 0455  WBC 11.1*   < > 11.8* 12.5* 12.0* 11.0* 11.2*  NEUTROABS 8.4*  --   --   --   --   --   --   HGB 7.7*   < > 7.5* 8.1* 7.7* 7.6* 8.9*  HCT 24.2*   < > 24.3* 25.7* 24.1* 24.8* 27.1*  MCV 100.0   < > 100.4* 100.4* 100.0 102.5* 95.8  PLT 211   < > 225 246 228 237 238   < > = values in this interval not displayed.   Cardiac Enzymes: No results for input(s): CKTOTAL, CKMB, CKMBINDEX, TROPONINI in the last 168 hours. BNP: Invalid input(s): POCBNP CBG: No results for input(s): GLUCAP in the last 168 hours. D-Dimer No results for input(s): DDIMER in the last 72 hours. Hgb A1c No results for input(s): HGBA1C in the last 72 hours. Lipid Profile No results for input(s): CHOL, HDL, LDLCALC, TRIG, CHOLHDL, LDLDIRECT in the last 72 hours. Thyroid function  studies No results for input(s): TSH, T4TOTAL, T3FREE, THYROIDAB in the last 72 hours.  Invalid input(s): FREET3 Anemia work up No results for input(s): VITAMINB12, FOLATE, FERRITIN, TIBC, IRON, RETICCTPCT in the last 72 hours. Microbiology Recent Results (from the past 240 hour(s))  SARS CORONAVIRUS 2 (TAT 6-24 HRS) Nasopharyngeal Nasopharyngeal Swab     Status: None   Collection Time: 02/25/20 12:50 PM   Specimen: Nasopharyngeal Swab  Result Value Ref Range Status   SARS Coronavirus 2 NEGATIVE NEGATIVE Final    Comment: (NOTE) SARS-CoV-2 target nucleic acids are NOT DETECTED. The SARS-CoV-2 RNA  is generally detectable in upper and lower respiratory specimens during the acute phase of infection. Negative results do not preclude SARS-CoV-2 infection, do not rule out co-infections with other pathogens, and should not be used as the sole basis for treatment or other patient management decisions. Negative results must be combined with clinical observations, patient history, and epidemiological information. The expected result is Negative. Fact Sheet for Patients: SugarRoll.be Fact Sheet for Healthcare Providers: https://www.woods-mathews.com/ This test is not yet approved or cleared by the [place] FDA and  has been authorized for detection and/or diagnosis of SARS-CoV-2 by FDA under an Emergency Use Authorization (EUA). This EUA will remain  in effect (meaning this test can be used) for the duration of the COVID-19 declaration under Section 56 4(b)(1) of the Act, 21 U.S.C. section 360bbb-3(b)(1), unless the authorization is terminated or revoked sooner. Performed at [facility], [place] [address] [place], [place] [address]      Discharge Instructions:   Discharge Instructions    Diet - low sodium heart healthy   Complete by: As directed    Discharge instructions   Complete by: As directed    Follow-up with your primary  care physician in 1 week.  Check CBC blood work at that time.  Take laxatives as prescribed to move your bowels every day.  Please seek medical attention for worsening symptoms/ongoing bleeding.  Try to avoid over-the-counter pain medication like motrin, aleeve except for Tylenol.   Increase activity slowly   Complete by: As directed      Allergies as of 02/27/2020   No Known Allergies     Medication List    STOP taking these medications   meloxicam 15 MG tablet Commonly known as: MOBIC   oxyCODONE-acetaminophen 5-325 MG tablet Commonly known as: PERCOCET/ROXICET   sennosides 8.8 MG/5ML syrup Commonly known as: SENOKOT Replaced by: senna 8.6 MG Tabs tablet     TAKE these medications   acetaminophen 325 MG tablet Commonly known as: TYLENOL Take 650 mg by mouth every 6 (six) hours as needed for mild pain or headache.   gabapentin 300 MG capsule Commonly known as: NEURONTIN Take 300 mg by mouth 3 (three) times daily.   GLUCOSAMINE PO Take 1 tablet by mouth 2 (two) times daily.   hydrocortisone 2.5 % rectal cream Commonly known as: Anusol-HC Place 1 application rectally 4 (four) times daily as needed for hemorrhoids or anal itching.   lovastatin 20 MG tablet Commonly known as: MEVACOR Take 20 mg by mouth at bedtime.   polyethylene glycol 17 g packet Commonly known as: MiraLax Take 17 g by mouth daily as needed for moderate constipation.   senna 8.6 MG Tabs tablet Commonly known as: SENOKOT Take 1 tablet (8.6 mg total) by mouth at bedtime. Replaces: sennosides 8.8 MG/5ML syrup         Time coordinating discharge: 39 minutes  Signed:  Mehdi Gironda  [facility] 02/27/2020, 11:25 AM

## 2020-02-27 NOTE — Progress Notes (Signed)
Patient discharging home.  IVs removed - WNL.  Reviewed AVS and meds with patient using interpreter services iPad.  Patient verbalizes understanding of instructions and aware to follow up with PCP in 1 week.  All questions answered satisfactorily. Patient calling family for pick-up, may be later due to their work schedule. Patient in NAD at this time

## 2020-02-27 NOTE — Discharge Instructions (Signed)
Ch?y mu ???ng tiu ha Gastrointestinal Bleeding Ch?y mu ???ng tiu ha l ch?y mu ? m?t ch? no ? d?c theo ???ng tiu ha, gi?a mi?ng v h?u mn. ???ng ny bao g?m mi?ng, th?c qu?n, d? dy, ru?t non, ru?t gi v h?u mn. Ru?t gi th??ng ???c g?i l ??i trng. Ch?y mu ???ng tiu ha c th? do nhi?u v?n ?? khc nhau gy ra. M?c ?? n?ng c?a nh?ng v?n ?? ny c th? t? nh? ??n nghim tr?ng ho?c th?m ch ?e d?a tnh m?ng. N?u qu v? b? ch?y mu ???ng tiu ha, qu v? c th? th?y mu trong phn (phn), qy v? c th? c phn ?en, ho?c c th? nn ra mu. N?u b? m?t nhi?u mu, qu v? c th? c?n ? l?i b?nh vi?n. Nguyn nhn g gy ra? Tnh tr?ng ny c th? do:  Vim, kch thch ho?c s?ng th?c qu?n (vim th?c qu?n). Th?c qu?n l ph?n c? th? di chuy?n th?c ?n t? mi?ng xu?ng d? dy.  S?ng t?nh m?ch ? tr?c trng (b?nh tr?).  Cc vng n?t rch ?au ??n ? h?u mn th??ng l do ?i ngoi phn c?ng (n?t h?u mn) gy ra.  Cc ti hnh thnh trn ??i trng theo th?i gian, khi gi ?i v c th? ch?y mu nhi?u (ti th?a ??i trng).  Vim (vim ti th?a) ? nh?ng khu v?c c ti th?a. Tnh tr?ng ny c th? gy ?au, s?t v phn c mu, m?c d ch?y mu c th? ? m?c ?? nh?.  Kh?i u (polyp) ho?c ung th?. Ung th? ??i trng th??ng b?t ??u ? d?ng polyp ti?n ung th?.  Vim v lot d? dy. V?i nh?ng tnh tr?ng ny, ch?y mu c th? xu?t hi?n t? ???ng tiu ha trn, g?n d? dy. ?i?u g lm t?ng nguy c?? Tnh tr?ng ny d? x?y ra h?n n?u qu v?:  B? nhi?m trng trong d? dy do m?t lo?i vi khu?n c tn Helicobacter pylori.  Dng m?t s? lo?i thu?c nh?t ??nh, ch?ng h?n nh?: ? Thu?c ch?ng vim khng steroid (NSAID). ? Aspirin. ? Thu?c ?c ch? ti h?p thu ch?n l?c serotonin (SSRIS). ? Thu?c steroid. ? Thu?c khng ti?u c?u ho?c thu?c ch?ng ?ng mu.  Khi.  U?ng r??u. Cc d?u hi?u ho?c tri?u ch?ng l g? Nh?ng tri?u ch?ng th??ng g?p c?a tnh tr?ng ny bao g?m:  Nn ra mu ?? t??i ho?c nn ra ch?t gi?ng nh? b c  ph.  Phn c mu, c mu ?en ho?c mu h?c n. ? Ch?y mu t? ???ng tiu ha d??i th??ng s? khi?n phn c mu mu ?? ho?c ?? nu. ? Ch?y mu t? ???ng tiu ha trn c th? khi?n phn c mu ?en, trng nh? h?c n, mi hi h?n bnh th??ng. ? Trong m?t s? tr??ng h?p nh?t ??nh, n?u ch?y mu ?? nhanh, phn c th? c mu ??.  ?au ho?c co th?t ? b?ng. Ch?n ?on tnh tr?ng ny nh? th? no? Tnh tr?ng ny c th? ???c ch?n ?on d?a vo:  B?nh s? v khm th?c th?.  Nhi?u xt nghi?m khc nhau, ch?ng h?n nh?: ? Xt nghi?m mu. ? Xt nghi?m phn. ? Ch?p x-quang v cc ki?m tra hnh ?nh khc. ? N?i soi d? dy th?c qu?n (EGD). Trong ki?m tra ny, m?t ?ng m?m, c g?n ?n ???c dng ?? soi th?c qu?n, d? dy v ru?t non. ? N?i soi ??i trng. Trong ki?m tra ny, m?t ?ng m?m, c g?n ?n ???c  dng ?? soi ??i trng. Tnh tr?ng ny ???c ?i?u tr? nh? th? no? Vi?c ?i?u tr? tnh tr?ng ny ty thu?c vo nguyn gy ch?y mu. V d?:  ??i v?i ch?y mu t? th?c qu?n, d? dy, ru?t non ho?c ??i trng, chuyn gia ch?m [place] s?c kh?e ti?n hnh EGD ho?c n?i soi ??i trng c th? c kh? n?ng lm d?ng ch?y mu nh? l m?t ph?n c?a th? thu?t.  Vim ho?c nhi?m trng ??i trng c th? ???c ?i?u tr? b?ng thu?c.  M?t s? v?n ?? v? tr?c trng c th? ???c ?i?u tr? b?ng kem, thu?c ??n ho?c t?m b?ng n??c ?m.  C th? dng thu?c ?? gi?m axit trong d? dy c?a qu v?.  ?i khi c?n ph?u thu?t.  Truy?n mu ?i khi c?n thi?t n?u b? m?t nhi?u mu. N?u ch?y mu ? m?c ?? nh?, qu v? c th? ???c cho v? nh. N?u b? m?t nhi?u mu, qu v? s? c?n ? l?i b?nh vi?n ?? theo di. Tun th? nh?ng h??ng d?n ny ? nh:   Ch? s? d?ng thu?c khng k ??n v thu?c k ??n theo ch? d?n c?a chuyn gia ch?m [place] s?c kh?e.  ?n th?c ?n giu ch?t x? nh? ??u, ng? c?c nguyn cm, tri cy t??i v rau. ?i?u ny s? gip gi? cho phn m?m. ?n 1-3 qu? m?n m?i ngy c tc d?ng t?t v?i nhi?u ng??i.  U?ng ?? n??c ?? gi? cho n??c ti?u c mu vng nh?t.  Tun th? t?t c? cc  l?n khm theo di theo ch? d?n c?a chuyn gia ch?m [place] s?c kh?e. ?i?u ny c vai tr quan tr?ng. Hy lin l?c v?i chuyn gia ch?m [place] s?c kh?e n?u:  Cc tri?u ch?ng c?a qu v? khng c?i thi?n. Yu c?u tr? gip ngay l?p t?c n?u:  Ch?y mu khng d?ng.  Qu v? c?m th?y chong vng ho?c ng?t x?u.  Qu v? c?m th?y y?u.  Qu v? b? chu?t rt n?ng ? l?ng ho?c ? b?ng.  Qu v? ??i ti?n c c?c mu ?ng l?n trong phn.  Cc tri?u ch?ng c?a qu v? tr? nn tr?m tr?ng h?n.  Qu v? b? ?au ng?c ho?c nh?p tim nhanh. Tm t?t  Ch?y mu ???ng tiu ha l ch?y mu ? m?t ch? no ? d?c theo ???ng tiu ha, gi?a mi?ng v h?u mn. Ch?y mu ???ng tiu ha c th? do nhi?u v?n ?? khc nhau gy ra. M?c ?? n?ng c?a nh?ng v?n ?? ny c th? t? nh? ??n nghim tr?ng ho?c th?m ch ?e d?a tnh m?ng.  Vi?c ?i?u tr? tnh tr?ng ny ty thu?c vo nguyn gy ch?y mu.  Ch? s? d?ng thu?c khng k ??n v thu?c k ??n theo ch? d?n c?a chuyn gia ch?m [place] s?c kh?e.  Tun th? t?t c? cc l?n khm theo di theo ch? d?n c?a chuyn gia ch?m [place] s?c kh?e. ?i?u ny c vai tr quan tr?ng.  Tm s? tr? gip ngay l?p t?c n?u qu v? b? ch?y mu nhi?u h?n, cc tri?u ch?ng tr? nn tr?m tr?ng h?n ho?c c cc tri?u ch?ng m?i. Thng tin ny khng nh?m m?c ?ch thay th? cho l?i khuyn m chuyn gia ch?m [place] s?c kh?e ni v?i qu v?. Hy b?o ??m qu v? ph?i th?o lu?n b?t k? v?n ?? g m qu v? c v?i chuyn gia ch?m  s?c kh?e c?a qu v?. Document Revised: 07/31/2018 Document Reviewed: 07/31/2018 Elsevier Patient Education  2020 [employer].

## 2023-07-13 ENCOUNTER — Ambulatory Visit (HOSPITAL_COMMUNITY)
Admission: EM | Admit: 2023-07-13 | Discharge: 2023-07-13 | Disposition: A | Discharge status: Home / Self Care | Payer: Medicare Other | Attending: Family Medicine | Admitting: Family Medicine

## 2023-07-13 ENCOUNTER — Encounter (HOSPITAL_COMMUNITY): Payer: Self-pay

## 2023-07-13 DIAGNOSIS — R519 Headache, unspecified: Secondary | ICD-10-CM | POA: Insufficient documentation

## 2023-07-13 DIAGNOSIS — H538 Other visual disturbances: Secondary | ICD-10-CM | POA: Insufficient documentation

## 2023-07-13 LAB — BASIC METABOLIC PANEL
Anion gap: 9 (ref 5–15)
BUN: 28 mg/dL — ABNORMAL HIGH (ref 8–23)
CO2: 25 mmol/L (ref 22–32)
Calcium: 9 mg/dL (ref 8.9–10.3)
Chloride: 103 mmol/L (ref 98–111)
Creatinine, Ser: 1.1 mg/dL (ref 0.61–1.24)
GFR, Estimated: >60 mL/min (ref >60)
Glucose, Bld: 90 mg/dL (ref 70–99)
Potassium: 3.9 mmol/L (ref 3.5–5.1)
Sodium: 137 mmol/L (ref 135–145)

## 2023-07-13 LAB — CBC
HCT: 47 % (ref 39.0–52.0)
Hemoglobin: 15.1 g/dL (ref 13.0–17.0)
MCH: 30.5 pg (ref 26.0–34.0)
MCHC: 32.1 g/dL (ref 30.0–36.0)
MCV: 94.9 fL (ref 80.0–100.0)
Platelets: 311 10*3/uL (ref 150–400)
RBC: 4.95 MIL/uL (ref 4.22–5.81)
RDW: 13.4 % (ref 11.5–15.5)
WBC: 12.1 10*3/uL — ABNORMAL HIGH (ref 4.0–10.5)
nRBC: 0 % (ref 0.0–0.2)

## 2023-07-13 LAB — SEDIMENTATION RATE: Sed Rate: 5 mm/hr (ref 0–16)

## 2023-07-13 MED ORDER — MELOXICAM 7.5 MG PO TABS: 7.5000 mg | ORAL_TABLET | Freq: Every day | ORAL | 0 refills | Status: AC | PRN

## 2023-07-13 NOTE — Discharge Instructions (Signed)
We have drawn blood to check your blood counts, electrolytes and sugar, and is a marker for inflammation.  If anything is significantly abnormal we will call you. (Chng ti ? l?y mu ?? ki?m tra cng th?c mu, ch?t ?i?n gi?i v l??ng ???ng c?a b?n, ??ng th?i l d?u hi?u cho th?y tnh tr?ng vim.  N?u c ?i?u g b?t th??ng ?ng k?, chng ti s? g?i cho b?n)  Take meloxicam 7.5 mg--1 daily for pain.  You can also take Tylenol as needed over-the-counter. (U?ng meloxicam 7,5 mg--1 m?i ngy ?? gi?m ?au.  B?n c?ng c th? dng Tylenol khi c?n thi?t v khng c?n k ??n.)  If you worsen anyway, please proceed to the emergency room for further evaluation (N?u tnh tr?ng tr? nn tr?m tr?ng h?n, vui lng ??n phng c?p c?u ?? ?nh gi thm)

## 2023-07-13 NOTE — ED Provider Notes (Addendum)
[facility]    CSN: [id number] Arrival date & time: 07/13/23  0808      History   Chief Complaint No chief complaint on file.   HPI Jamie Short is a 72 y.o. male.   HPI Here for pain in his right temporal area that began on July 30.  He has not had any fever or cough or congestion.  No nausea or vomiting or diarrhea.  No ear pain.  No sore throat.  He states that then on July 31 he had some type of eye surgery, he does not know what.  He states it was for some blurry vision and he took off the eye patch yesterday.  The headache is worsened over the last 2 to 3 days.  It is unclear if he will let his ophthalmologist know about the headache.  With interpreter's help I have tried to ascertain whether or not his vision is worse since his headache is been going on.  After several attempts, all he would say is that it is still little blurry and the doctor said not to expect improvement for several days.  His blood pressure here is 95/57.  He states he takes medications for hypertension.  He is not having any dizziness or syncope  Past Medical History:  Diagnosis Date   Arthritis    lower legs   BPH (benign prostatic hyperplasia)    Foley catheter in place    Urinary retention    Wears dentures    Wears glasses     Patient Active Problem List   Diagnosis Date Noted   GI bleed 02/25/2020   Anemia associated with acute blood loss 02/25/2020   Orthostatic hypotension 02/25/2020   Hemorrhoids 02/25/2020   Rectal bleeding    BPH (benign prostatic hyperplasia) 10/18/2015   Colon cancer screening 10/08/2014   Back pain of thoracolumbar region 09/10/2013   BPH (benign prostatic hypertrophy) 09/10/2013    Past Surgical History:  Procedure Laterality Date   COLONOSCOPY WITH PROPOFOL N/A 02/26/2020   Procedure: COLONOSCOPY WITH PROPOFOL;  Surgeon: Beverley Fiedler, MD;  Location: [facility];  Service: Gastroenterology;  Laterality: N/A;   LAPAROSCOPIC CHOLECYSTECTOMY   2006 approx   TRANSURETHRAL RESECTION OF PROSTATE N/A 10/18/2015   Procedure: TRANSURETHRAL RESECTION OF THE PROSTATE WITH 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 Authorizing Provider  meloxicam (MOBIC) 7.5 MG tablet Take 1 tablet (7.5 mg total) by mouth daily as needed for up to 7 days for pain. 07/13/23 07/20/23 Yes Zenia Resides, MD  acetaminophen (TYLENOL) 325 MG tablet Take 650 mg by mouth every 6 (six) hours as needed for mild pain or headache.    [provider]  gabapentin (NEURONTIN) 300 MG capsule Take 300 mg by mouth 3 (three) times daily. 01/27/20   [provider]  Glucosamine HCl (GLUCOSAMINE PO) Take 1 tablet by mouth 2 (two) times daily.    [provider]  lovastatin (MEVACOR) 20 MG tablet Take 20 mg by mouth at bedtime. 01/27/20   [provider]    Family History History reviewed. No pertinent family history.  Social History Social History   Tobacco Use   Smoking status: Former    Current packs/day: 0.50  Average packs/day: 0.5 packs/day for 40.0 years (20.0 ttl pk-yrs)    Types: Cigarettes   Smokeless tobacco: Never  Vaping Use   Vaping status: Never Used  Substance Use Topics   Alcohol use: No   Drug use: No     Allergies   Patient has no known allergies.   Review of Systems Review of Systems   Physical Exam Triage Vital Signs ED Triage Vitals  Encounter Vitals Group     BP 07/13/23 0836 (!) 95/57     Systolic BP Percentile --      Diastolic BP Percentile --      Pulse Rate 07/13/23 0836 70     Resp 07/13/23 0836 14     Temp 07/13/23 0836 98.1 F (36.7 C)     Temp Source 07/13/23 0836 Oral     SpO2 07/13/23 0836 95 %     Weight --      Height --      Head Circumference --       Peak Flow --      Pain Score 07/13/23 0837 9     Pain Loc --      Pain Education --      Exclude from Growth Chart --    No data found.  Updated Vital Signs BP (!) 95/57 (BP Location: Left Arm)   Pulse 70   Temp 98.1 F (36.7 C) (Oral)   Resp 14   SpO2 95%   Visual Acuity Right Eye Distance:   Left Eye Distance:   Bilateral Distance:    Right Eye Near:   Left Eye Near:    Bilateral Near:     Physical Exam Vitals reviewed.  Constitutional:      General: He is not in acute distress.    Appearance: He is not toxic-appearing.  HENT:     Head: Normocephalic and atraumatic.     Comments: He is maybe a little tender in the right temporal area above his zygomatic arch.  There is no rash or erythema or swelling there.    Right Ear: Tympanic membrane and ear canal normal.     Left Ear: Tympanic membrane and ear canal normal.     Nose: Nose normal.     Mouth/Throat:     Mouth: Mucous membranes are moist.     Pharynx: No oropharyngeal exudate or posterior oropharyngeal erythema.  Eyes:     Extraocular Movements: Extraocular movements intact.     Conjunctiva/sclera: Conjunctivae normal.     Pupils: Pupils are equal, round, and reactive to light.  Cardiovascular:     Rate and Rhythm: Normal rate and regular rhythm.     Heart sounds: No murmur heard. Pulmonary:     Effort: No respiratory distress.     Breath sounds: No stridor. No wheezing, rhonchi or rales.  Musculoskeletal:     Cervical back: Neck supple.  Lymphadenopathy:     Cervical: No cervical adenopathy.  Skin:    Capillary Refill: Capillary refill takes less than 2 seconds.     Coloration: Skin is not jaundiced or pale.  Neurological:     General: No focal deficit present.     Mental Status: He is alert and oriented to person, place, and time.  Psychiatric:        Behavior: Behavior normal.      UC Treatments / Results  Labs (all labs ordered are listed, but only abnormal results are displayed) Labs  Reviewed  CBC  BASIC METABOLIC  PANEL  SEDIMENTATION RATE    EKG   Radiology No results found.  Procedures Procedures (including critical care time)  Medications Ordered in UC Medications - No data to display  Initial Impression / Assessment and Plan / UC Course  I have reviewed the triage vital signs and the nursing notes.  Pertinent labs & imaging results that were available during my care of the patient were reviewed by me and considered in my medical decision making (see chart for details).      Visit is conducted in [place] with the help of video interpretation.  CBC, BMP, and ESR are drawn today.  If anything is significantly abnormal we will let him know.  Last EGFR was normal in epic. Final Clinical Impressions(s) / UC Diagnoses   Final diagnoses:  Temporal headache  Blurry vision     Discharge Instructions      We have drawn blood to check your blood counts, electrolytes and sugar, and is a marker for inflammation.  If anything is significantly abnormal we will call you. (Chng ti ? l?y mu ?? ki?m tra cng th?c mu, ch?t ?i?n gi?i v l??ng ???ng c?a b?n, ??ng th?i l d?u hi?u cho th?y tnh tr?ng vim.  N?u c ?i?u g b?t th??ng ?ng k?, chng ti s? g?i cho b?n)  Take meloxicam 7.5 mg--1 daily for pain.  You can also take Tylenol as needed over-the-counter. (U?ng meloxicam 7,5 mg--1 m?i ngy ?? gi?m ?au.  B?n c?ng c th? dng Tylenol khi c?n thi?t v khng c?n k ??n.)  If you worsen anyway, please proceed to the emergency room for further evaluation (N?u tnh tr?ng tr? nn tr?m tr?ng h?n, vui lng ??n phng c?p c?u ?? ?nh gi thm)     ED Prescriptions     Medication Sig Dispense Auth. Provider   meloxicam (MOBIC) 7.5 MG tablet Take 1 tablet (7.5 mg total) by mouth daily as needed for up to 7 days for pain. 7 tablet Ralph Brouwer, Janace Aris, MD      PDMP not reviewed this encounter.   Zenia Resides, MD 07/13/23 7829    Zenia Resides,  MD 07/13/23 5621    Zenia Resides, MD 07/13/23 [phone]

## 2023-07-13 NOTE — ED Triage Notes (Signed)
Per Myles Rosenthal #[id number]  Patient c/o pain in the right temple x 3 days. Patient also reports that he had left eye surgery for possible cataract removal.   Patient states he took something for his headache last night, but not sure what he took.

## 2024-03-18 ENCOUNTER — Ambulatory Visit
Admission: EM | Admit: 2024-03-18 | Discharge: 2024-03-18 | Disposition: A | Discharge status: Home / Self Care | Attending: Family Medicine | Admitting: Family Medicine

## 2024-03-18 DIAGNOSIS — R0789 Other chest pain: Secondary | ICD-10-CM | POA: Diagnosis not present

## 2024-03-18 MED ORDER — OMEPRAZOLE 40 MG PO CPDR: 40.0000 mg | DELAYED_RELEASE_CAPSULE | Freq: Every day | ORAL | 0 refills | Status: DC

## 2024-03-18 MED ORDER — PREDNISONE 20 MG PO TABS: 40.0000 mg | ORAL_TABLET | Freq: Every day | ORAL | 0 refills | Status: AC

## 2024-03-18 MED ORDER — TRAMADOL HCL 50 MG PO TABS: 50.0000 mg | ORAL_TABLET | Freq: Four times a day (QID) | ORAL | 0 refills | Status: DC | PRN

## 2024-03-18 NOTE — ED Triage Notes (Signed)
 Due to language barrier, an interpreter was present during the history-taking and subsequent discussion (and for part of the physical exam) with this patient. Jamie Short. Number: [id number].  "I have been having chest pain and belly pain since Saturday that isn't getting better".

## 2024-03-18 NOTE — Discharge Instructions (Signed)
 The EKG was normal. (?i?n tm ?? bnh th??ng.)  Take prednisone 20 mg--2 daily for 5 days. (U?ng prednisone 20 mg--2 vin m?i ngy trong 5 ngy)  Take tramadol 50 mg-- 1 tablet every 6 hours as needed for pain.  This medication can make you sleepy or dizzy. (U?ng tramadol 50 mg--1 vin m?i 6 gi? khi c?n ?? gi?m ?au. Thu?c ny c th? khi?n b?n bu?n ng? ho?c chng m?t)  Take omeprazole 40 mg--1 capsule daily for stomach acid, to be taken when you are taking the prednisone. (U?ng omeprazole 40 mg--1 vin m?i ngy ?? ?i?u tr? axit d? dy, u?ng khi b?n ?ang dng prednisone.)  If you worsen in any way, please go the emergency room. (N?u b?n c?m th?y t? h?n theo b?t k? cch no, hy ??n phng c?p c?u)

## 2024-03-18 NOTE — ED Provider Notes (Signed)
 [facility]    CSN: [id number] Arrival date & time: 03/18/24  4098      History   Chief Complaint Chief Complaint  Patient presents with   Pain    HPI Jamie Short is a 73 y.o. male.   HPI Here for pain in his left lower rib cage/chest x 3 days. He took some pain relief medication that helped, but the pain returned the next morning.  Is been fairly constant.  He does not note any exacerbating factors.  No fever or cough or congestion.  He did have some diarrhea 3 days ago.  He felt full constipated yesterday and has not had a bowel movement so far.  No vomiting  He is not allergic to any medication   no history of diabetes   He does also have some left-sided headache. Past Medical History:  Diagnosis Date   Arthritis    lower legs   BPH (benign prostatic hyperplasia)    Foley catheter in place    Urinary retention    Wears dentures    Wears glasses     Patient Active Problem List   Diagnosis Date Noted   GI bleed 02/25/2020   Anemia associated with acute blood loss 02/25/2020   Orthostatic hypotension 02/25/2020   Hemorrhoids 02/25/2020   Rectal bleeding    BPH (benign prostatic hyperplasia) 10/18/2015   Colon cancer screening 10/08/2014   Back pain of thoracolumbar region 09/10/2013   Benign prostatic hyperplasia 09/10/2013    Past Surgical History:  Procedure Laterality Date   COLONOSCOPY WITH PROPOFOL N/A 02/26/2020   Procedure: COLONOSCOPY WITH PROPOFOL;  Surgeon: Beverley Fiedler, MD;  Location: [facility];  Service: Gastroenterology;  Laterality: N/A;   LAPAROSCOPIC CHOLECYSTECTOMY  2006 approx   TRANSURETHRAL RESECTION OF PROSTATE N/A 10/18/2015   Procedure: TRANSURETHRAL RESECTION OF THE PROSTATE WITH 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 Authorizing Provider  finasteride (PROSCAR) 5 MG tablet Take 5 mg by mouth daily. 02/20/24  Yes [provider]  lisinopril-hydrochlorothiazide (ZESTORETIC) 20-25 MG tablet Take 0.5 tablets by mouth daily. 01/14/24  Yes [provider]  omeprazole (PRILOSEC) 40 MG capsule Take 1 capsule (40 mg total) by mouth daily for 5 days. 03/18/24 03/23/24 Yes Marcelyn Ruppe, Janace Aris, MD  predniSONE (DELTASONE) 20 MG tablet Take 2 tablets (40 mg total) by mouth daily with breakfast for 5 days. 03/18/24 03/23/24 Yes Zenia Resides, MD  simvastatin (ZOCOR) 20 MG tablet Take 20 mg by mouth at bedtime. 02/12/24  Yes [provider]  traMADol (ULTRAM) 50 MG tablet Take 1 tablet (50 mg total) by mouth every 6 (six) hours as needed (pain). 03/18/24  Yes Zenia Resides, MD  acetaminophen (TYLENOL) 325 MG tablet Take 650 mg by mouth every 6 (six) hours as needed for mild pain or headache.    [provider]  gabapentin (NEURONTIN) 300 MG capsule Take 300 mg by mouth 3 (three) times daily. 01/27/20   [provider]  Glucosamine HCl (GLUCOSAMINE PO) Take 1 tablet by mouth 2 (two) times daily.    [provider]  lovastatin (MEVACOR) 20 MG tablet Take 20 mg by mouth at bedtime. 01/27/20   [provider]    Family History  History reviewed. No pertinent family history.  Social History Social History   Tobacco Use   Smoking status: Former    Current packs/day: 0.50    Average packs/day: 0.5 packs/day for 40.0 years (20.0 ttl pk-yrs)    Types: Cigarettes   Smokeless tobacco: Never  Vaping Use   Vaping status: Never Used  Substance Use Topics   Alcohol use: No   Drug use: No     Allergies   Patient has no known allergies.   Review of Systems Review of Systems   Physical Exam Triage Vital Signs ED Triage Vitals  Encounter Vitals Group     BP 03/18/24 0845 111/64     Systolic BP Percentile --      Diastolic BP Percentile  --      Pulse Rate 03/18/24 0845 80     Resp 03/18/24 0845 20     Temp 03/18/24 0845 98 F (36.7 C)     Temp Source 03/18/24 0845 Oral     SpO2 03/18/24 0845 98 %     Weight 03/18/24 0854 149 lb 14.6 oz (68 kg)     Height 03/18/24 0854 5\' 4"  (1.626 m)     Head Circumference --      Peak Flow --      Pain Score --      Pain Loc --      Pain Education --      Exclude from Growth Chart --    No data found.  Updated Vital Signs BP 111/64 (BP Location: Left Arm)   Pulse 80   Temp 98 F (36.7 C) (Oral)   Resp 20   Ht 5\' 4"  (1.626 m)   Wt 68 kg   SpO2 98%   BMI 25.73 kg/m   Visual Acuity Right Eye Distance:   Left Eye Distance:   Bilateral Distance:    Right Eye Near:   Left Eye Near:    Bilateral Near:     Physical Exam Vitals reviewed.  Constitutional:      General: He is not in acute distress.    Appearance: He is not ill-appearing, toxic-appearing or diaphoretic.  HENT:     Nose: Nose normal.     Mouth/Throat:     Mouth: Mucous membranes are moist.     Pharynx: No oropharyngeal exudate or posterior oropharyngeal erythema.  Eyes:     Extraocular Movements: Extraocular movements intact.     Conjunctiva/sclera: Conjunctivae normal.     Pupils: Pupils are equal, round, and reactive to light.  Cardiovascular:     Rate and Rhythm: Normal rate and regular rhythm.     Heart sounds: No murmur heard. Pulmonary:     Effort: Pulmonary effort is normal. No respiratory distress.     Breath sounds: Normal breath sounds. No stridor. No wheezing, rhonchi or rales.  Chest:     Chest wall: Tenderness (He is very tender in the anterior axillary line at about rib 9 or 10.  No rash or deformity there.) present.  Musculoskeletal:     Cervical back: Neck supple.  Lymphadenopathy:     Cervical: No cervical adenopathy.  Skin:    Coloration: Skin is not pale.  Neurological:     General: No focal deficit present.     Mental Status: He is alert and oriented to person, place, and  time.  Psychiatric:        Behavior: Behavior normal.      UC Treatments / Results  Labs (  all labs ordered are listed, but only abnormal results are displayed) Labs Reviewed - No data to display  EKG   Radiology No results found.  Procedures Procedures (including critical care time)  Medications Ordered in UC Medications - No data to display  Initial Impression / Assessment and Plan / UC Course  I have reviewed the triage vital signs and the nursing notes.  Pertinent labs & imaging results that were available during my care of the patient were reviewed by me and considered in my medical decision making (see chart for details).  Video translation in [place] is used for the visit.  EKG is normal.  This seems most consistent with chest wall pain and costochondritis.  Prednisone is sent in for 5 days plus some tramadol for pain relief.  He does have a history of a GI bleed.  Protonix is sent in to protect the stomach while he is taking the prednisone.   Final Clinical Impressions(s) / UC Diagnoses   Final diagnoses:  Chest wall pain   Discharge Instructions   None    ED Prescriptions     Medication Sig Dispense Auth. Provider   predniSONE (DELTASONE) 20 MG tablet Take 2 tablets (40 mg total) by mouth daily with breakfast for 5 days. 10 tablet Zenia Resides, MD   omeprazole (PRILOSEC) 40 MG capsule Take 1 capsule (40 mg total) by mouth daily for 5 days. 5 capsule Zenia Resides, MD   traMADol (ULTRAM) 50 MG tablet Take 1 tablet (50 mg total) by mouth every 6 (six) hours as needed (pain). 12 tablet Audie Stayer, Janace Aris, MD      I have reviewed the PDMP during this encounter.   Zenia Resides, MD 03/18/24 [phone]

## 2024-03-26 ENCOUNTER — Inpatient Hospital Stay (HOSPITAL_COMMUNITY)

## 2024-03-26 ENCOUNTER — Encounter (HOSPITAL_COMMUNITY): Payer: Self-pay

## 2024-03-26 ENCOUNTER — Emergency Department (HOSPITAL_COMMUNITY)

## 2024-03-26 ENCOUNTER — Other Ambulatory Visit: Payer: Self-pay

## 2024-03-26 ENCOUNTER — Inpatient Hospital Stay (HOSPITAL_COMMUNITY)
Admission: EM | Admit: 2024-03-26 | Discharge: 2024-03-29 | DRG: 442 | Disposition: A | Discharge status: Home / Self Care | Attending: Internal Medicine | Admitting: Internal Medicine

## 2024-03-26 DIAGNOSIS — D649 Anemia, unspecified: Secondary | ICD-10-CM | POA: Diagnosis present

## 2024-03-26 DIAGNOSIS — Z79899 Other long term (current) drug therapy: Secondary | ICD-10-CM | POA: Diagnosis not present

## 2024-03-26 DIAGNOSIS — Z6823 Body mass index (BMI) 23.0-23.9, adult: Secondary | ICD-10-CM

## 2024-03-26 DIAGNOSIS — N401 Enlarged prostate with lower urinary tract symptoms: Secondary | ICD-10-CM | POA: Diagnosis present

## 2024-03-26 DIAGNOSIS — K7689 Other specified diseases of liver: Secondary | ICD-10-CM | POA: Diagnosis present

## 2024-03-26 DIAGNOSIS — R338 Other retention of urine: Secondary | ICD-10-CM | POA: Diagnosis present

## 2024-03-26 DIAGNOSIS — E440 Moderate protein-calorie malnutrition: Secondary | ICD-10-CM | POA: Diagnosis present

## 2024-03-26 DIAGNOSIS — Z87891 Personal history of nicotine dependence: Secondary | ICD-10-CM

## 2024-03-26 DIAGNOSIS — K5730 Diverticulosis of large intestine without perforation or abscess without bleeding: Secondary | ICD-10-CM | POA: Diagnosis present

## 2024-03-26 DIAGNOSIS — R072 Precordial pain: Secondary | ICD-10-CM | POA: Diagnosis present

## 2024-03-26 DIAGNOSIS — I10 Essential (primary) hypertension: Secondary | ICD-10-CM | POA: Diagnosis present

## 2024-03-26 DIAGNOSIS — N400 Enlarged prostate without lower urinary tract symptoms: Secondary | ICD-10-CM | POA: Diagnosis present

## 2024-03-26 DIAGNOSIS — E785 Hyperlipidemia, unspecified: Secondary | ICD-10-CM | POA: Diagnosis present

## 2024-03-26 DIAGNOSIS — Z9079 Acquired absence of other genital organ(s): Secondary | ICD-10-CM

## 2024-03-26 DIAGNOSIS — K5900 Constipation, unspecified: Secondary | ICD-10-CM | POA: Diagnosis present

## 2024-03-26 DIAGNOSIS — R6510 Systemic inflammatory response syndrome (SIRS) of non-infectious origin without acute organ dysfunction: Secondary | ICD-10-CM | POA: Diagnosis present

## 2024-03-26 DIAGNOSIS — J439 Emphysema, unspecified: Secondary | ICD-10-CM | POA: Diagnosis present

## 2024-03-26 DIAGNOSIS — K769 Liver disease, unspecified: Secondary | ICD-10-CM

## 2024-03-26 DIAGNOSIS — R109 Unspecified abdominal pain: Secondary | ICD-10-CM | POA: Diagnosis present

## 2024-03-26 DIAGNOSIS — D72829 Elevated white blood cell count, unspecified: Secondary | ICD-10-CM | POA: Diagnosis present

## 2024-03-26 DIAGNOSIS — R079 Chest pain, unspecified: Secondary | ICD-10-CM | POA: Diagnosis present

## 2024-03-26 LAB — URINALYSIS, ROUTINE W REFLEX MICROSCOPIC
Bilirubin Urine: NEGATIVE
Glucose, UA: NEGATIVE mg/dL
Hgb urine dipstick: NEGATIVE
Ketones, ur: NEGATIVE mg/dL
Leukocytes,Ua: NEGATIVE
Nitrite: NEGATIVE
Protein, ur: NEGATIVE mg/dL
Specific Gravity, Urine: 1.021 (ref 1.005–1.030)
pH: 7 (ref 5.0–8.0)

## 2024-03-26 LAB — CBC
HCT: 38.2 % — ABNORMAL LOW (ref 39.0–52.0)
Hemoglobin: 12.6 g/dL — ABNORMAL LOW (ref 13.0–17.0)
MCH: 31.8 pg (ref 26.0–34.0)
MCHC: 33 g/dL (ref 30.0–36.0)
MCV: 96.5 fL (ref 80.0–100.0)
Platelets: 466 10*3/uL — ABNORMAL HIGH (ref 150–400)
RBC: 3.96 MIL/uL — ABNORMAL LOW (ref 4.22–5.81)
RDW: 13.7 % (ref 11.5–15.5)
WBC: 22.1 10*3/uL — ABNORMAL HIGH (ref 4.0–10.5)
nRBC: 0 % (ref 0.0–0.2)

## 2024-03-26 LAB — COMPREHENSIVE METABOLIC PANEL WITH GFR
ALT: 30 U/L (ref 0–44)
AST: 15 U/L (ref 15–41)
Albumin: 2.8 g/dL — ABNORMAL LOW (ref 3.5–5.0)
Alkaline Phosphatase: 110 U/L (ref 38–126)
Anion gap: 8 (ref 5–15)
BUN: 24 mg/dL — ABNORMAL HIGH (ref 8–23)
CO2: 26 mmol/L (ref 22–32)
Calcium: 8.3 mg/dL — ABNORMAL LOW (ref 8.9–10.3)
Chloride: 100 mmol/L (ref 98–111)
Creatinine, Ser: 0.93 mg/dL (ref 0.61–1.24)
GFR, Estimated: >60 mL/min (ref >60)
Glucose, Bld: 121 mg/dL — ABNORMAL HIGH (ref 70–99)
Potassium: 4.1 mmol/L (ref 3.5–5.1)
Sodium: 134 mmol/L — ABNORMAL LOW (ref 135–145)
Total Bilirubin: 0.8 mg/dL (ref 0.0–1.2)
Total Protein: 6.6 g/dL (ref 6.5–8.1)

## 2024-03-26 LAB — I-STAT CG4 LACTIC ACID, ED: Lactic Acid, Venous: 0.4 mmol/L — ABNORMAL LOW (ref 0.5–1.9)

## 2024-03-26 LAB — TROPONIN I (HIGH SENSITIVITY)
Troponin I (High Sensitivity): 8 ng/L (ref <18)
Troponin I (High Sensitivity): 8 ng/L (ref <18)

## 2024-03-26 LAB — PROTIME-INR
INR: 1 (ref 0.8–1.2)
Prothrombin Time: 13.8 s (ref 11.4–15.2)

## 2024-03-26 LAB — LIPASE, BLOOD: Lipase: 30 U/L (ref 11–51)

## 2024-03-26 MED ORDER — ONDANSETRON HCL 4 MG PO TABS: 4.0000 mg | ORAL_TABLET | Freq: Four times a day (QID) | ORAL | Status: DC | PRN

## 2024-03-26 MED ORDER — SODIUM CHLORIDE 0.9 % IV SOLN: INTRAVENOUS | Status: AC

## 2024-03-26 MED ORDER — IOHEXOL 350 MG/ML SOLN
100.0000 mL | Freq: Once | INTRAVENOUS | Status: AC | PRN
  Administered 2024-03-26: 100 mL via INTRAVENOUS

## 2024-03-26 MED ORDER — SIMVASTATIN 20 MG PO TABS
20.0000 mg | ORAL_TABLET | Freq: Every day | ORAL | Status: DC
  Administered 2024-03-26 – 2024-03-28 (×3): 20 mg via ORAL
  Filled 2024-03-26 (×3): qty 1

## 2024-03-26 MED ORDER — SODIUM CHLORIDE (PF) 0.9 % IJ SOLN
INTRAMUSCULAR | Status: AC
  Filled 2024-03-26: qty 50

## 2024-03-26 MED ORDER — PIPERACILLIN-TAZOBACTAM 3.375 G IVPB
3.3750 g | Freq: Three times a day (TID) | INTRAVENOUS | Status: DC
  Administered 2024-03-26 – 2024-03-29 (×9): 3.375 g via INTRAVENOUS
  Filled 2024-03-26 (×9): qty 50

## 2024-03-26 MED ORDER — FENTANYL CITRATE PF 50 MCG/ML IJ SOSY
50.0000 ug | PREFILLED_SYRINGE | Freq: Once | INTRAMUSCULAR | Status: AC
  Administered 2024-03-26: 50 ug via INTRAVENOUS
  Filled 2024-03-26: qty 1

## 2024-03-26 MED ORDER — PIPERACILLIN-TAZOBACTAM 3.375 G IVPB 30 MIN
3.3750 g | Freq: Once | INTRAVENOUS | Status: AC
  Administered 2024-03-26: 3.375 g via INTRAVENOUS
  Filled 2024-03-26: qty 50

## 2024-03-26 MED ORDER — LIDOCAINE HCL 1 % IJ SOLN
INTRAMUSCULAR | Status: AC
  Filled 2024-03-26: qty 20

## 2024-03-26 MED ORDER — ONDANSETRON HCL 4 MG/2ML IJ SOLN: 4.0000 mg | Freq: Four times a day (QID) | INTRAMUSCULAR | Status: DC | PRN

## 2024-03-26 MED ORDER — ACETAMINOPHEN 650 MG RE SUPP: 650.0000 mg | Freq: Four times a day (QID) | RECTAL | Status: DC | PRN

## 2024-03-26 MED ORDER — SODIUM CHLORIDE 0.9 % IV BOLUS
500.0000 mL | Freq: Once | INTRAVENOUS | Status: AC
Start: 2024-03-26 — End: 2024-03-26
  Administered 2024-03-26: 500 mL via INTRAVENOUS

## 2024-03-26 MED ORDER — PROCHLORPERAZINE EDISYLATE 10 MG/2ML IJ SOLN
5.0000 mg | Freq: Once | INTRAMUSCULAR | Status: AC
  Administered 2024-03-26: 5 mg via INTRAVENOUS
  Filled 2024-03-26: qty 2

## 2024-03-26 MED ORDER — PANTOPRAZOLE SODIUM 40 MG PO TBEC
40.0000 mg | DELAYED_RELEASE_TABLET | Freq: Every day | ORAL | Status: DC
  Administered 2024-03-27 – 2024-03-29 (×3): 40 mg via ORAL
  Filled 2024-03-26 (×3): qty 1

## 2024-03-26 MED ORDER — LISINOPRIL-HYDROCHLOROTHIAZIDE 20-25 MG PO TABS: 0.5000 | ORAL_TABLET | Freq: Every day | ORAL | Status: DC

## 2024-03-26 MED ORDER — SODIUM CHLORIDE 0.9 % IV BOLUS
500.0000 mL | Freq: Once | INTRAVENOUS | Status: AC
  Administered 2024-03-26: 500 mL via INTRAVENOUS

## 2024-03-26 MED ORDER — FINASTERIDE 5 MG PO TABS
5.0000 mg | ORAL_TABLET | Freq: Every day | ORAL | Status: DC
  Administered 2024-03-27 – 2024-03-29 (×3): 5 mg via ORAL
  Filled 2024-03-26 (×3): qty 1

## 2024-03-26 MED ORDER — ACETAMINOPHEN 325 MG PO TABS
650.0000 mg | ORAL_TABLET | Freq: Four times a day (QID) | ORAL | Status: DC | PRN
  Administered 2024-03-27 – 2024-03-29 (×5): 650 mg via ORAL
  Filled 2024-03-26 (×5): qty 2

## 2024-03-26 MED ORDER — ONDANSETRON HCL 4 MG/2ML IJ SOLN
4.0000 mg | Freq: Once | INTRAMUSCULAR | Status: AC
  Administered 2024-03-26: 4 mg via INTRAVENOUS
  Filled 2024-03-26: qty 2

## 2024-03-26 MED ORDER — PIPERACILLIN-TAZOBACTAM 3.375 G IVPB 30 MIN: 3.3750 g | Freq: Three times a day (TID) | INTRAVENOUS | Status: DC

## 2024-03-26 NOTE — ED Notes (Signed)
 [place] PIV placed.

## 2024-03-26 NOTE — ED Notes (Signed)
 Patient transported to CT

## 2024-03-26 NOTE — ED Provider Notes (Signed)
 [place] EMERGENCY DEPARTMENT AT [facility] Provider Note   CSN: [id number] Arrival date & time: 03/26/24  0142     History  Chief Complaint  Patient presents with   Chest Pain   Abdominal Pain    Jamie Short is a 73 y.o. male.  The history is provided by the patient and medical records. A language interpreter was used.  Chest Pain Associated symptoms: abdominal pain   Abdominal Pain Associated symptoms: chest pain   Jamie Short is a 73 y.o. male who presents to the Emergency Department complaining of chest pain.  He presents to the emergency department for evaluation of 10 days of constant left-sided chest pain that is severe in nature.  He has been seen by his PCP as well as urgent care and was prescribed tramadol without relief.  He did have a similar episode several months ago that resolved over the course of the day.  He does report associated body aches and feels feverish.  He has not had a recorded temperature.  No cough, shortness of breath.  He does have pain that radiates throughout his abdomen.  He does have subjective constipation with like treatment for it.  His last BM was around 10 PM and is described as a small amount.  He is urinating without pain or difficulty.  No vomiting.   Hx/o HPL, HTN, prostate  Home Medications Prior to Admission medications   Medication Sig Start Date End Date Taking? Authorizing Provider  acetaminophen (TYLENOL) 325 MG tablet Take 650 mg by mouth every 6 (six) hours as needed for mild pain or headache.    [provider]  finasteride (PROSCAR) 5 MG tablet Take 5 mg by mouth daily. 02/20/24   [provider]  gabapentin (NEURONTIN) 300 MG capsule Take 300 mg by mouth 3 (three) times daily. 01/27/20   [provider]  Glucosamine HCl (GLUCOSAMINE PO) Take 1 tablet by mouth 2 (two) times daily.    [provider]  lisinopril-hydrochlorothiazide (ZESTORETIC) 20-25 MG tablet Take 0.5 tablets by  mouth daily. 01/14/24   [provider]  lovastatin (MEVACOR) 20 MG tablet Take 20 mg by mouth at bedtime. 01/27/20   [provider]  omeprazole (PRILOSEC) 40 MG capsule Take 1 capsule (40 mg total) by mouth daily for 5 days. 03/18/24 03/23/24  Zenia Resides, MD  simvastatin (ZOCOR) 20 MG tablet Take 20 mg by mouth at bedtime. 02/12/24   [provider]  traMADol (ULTRAM) 50 MG tablet Take 1 tablet (50 mg total) by mouth every 6 (six) hours as needed (pain). 03/18/24   Zenia Resides, MD      Allergies    Patient has no known allergies.    Review of Systems   Review of Systems  Cardiovascular:  Positive for chest pain.  Gastrointestinal:  Positive for abdominal pain.  All other systems reviewed and are negative.   Physical Exam Updated Vital Signs BP 119/69   Pulse 73   Temp 97.8 F (36.6 C)   Resp 18   SpO2 98%  Physical Exam Vitals and nursing note reviewed.  Constitutional:      Appearance: He is well-developed.  HENT:     Head: Normocephalic and atraumatic.  Cardiovascular:     Rate and Rhythm: Normal rate and regular rhythm.     Heart sounds: No murmur heard. Pulmonary:     Effort: Pulmonary effort is normal. No respiratory distress.     Breath sounds:  Normal breath sounds.  Chest:     Chest wall: No tenderness.  Abdominal:     Palpations: Abdomen is soft.     Tenderness: There is no abdominal tenderness. There is no guarding or rebound.  Musculoskeletal:        General: No swelling or tenderness.     Comments: 2+ radial and DP pulses bilaterally  Skin:    General: Skin is warm and dry.  Neurological:     Mental Status: He is alert and oriented to person, place, and time.  Psychiatric:        Behavior: Behavior normal.     ED Results / Procedures / Treatments   Labs (all labs ordered are listed, but only abnormal results are displayed) Labs Reviewed  CBC - Abnormal; Notable for the following components:      Result Value    WBC 22.1 (*)    RBC 3.96 (*)    Hemoglobin 12.6 (*)    HCT 38.2 (*)    Platelets 466 (*)    All other components within normal limits  COMPREHENSIVE METABOLIC PANEL WITH GFR - Abnormal; Notable for the following components:   Sodium 134 (*)    Glucose, Bld 121 (*)    BUN 24 (*)    Calcium 8.3 (*)    Albumin 2.8 (*)    All other components within normal limits  I-STAT CG4 LACTIC ACID, ED - Abnormal; Notable for the following components:   Lactic Acid, Venous 0.4 (*)    All other components within normal limits  CULTURE, BLOOD (ROUTINE X 2)  CULTURE, BLOOD (ROUTINE X 2)  LIPASE, BLOOD  URINALYSIS, ROUTINE W REFLEX MICROSCOPIC  I-STAT CG4 LACTIC ACID, ED  TROPONIN I (HIGH SENSITIVITY)  TROPONIN I (HIGH SENSITIVITY)    EKG EKG Interpretation Date/Time:  Wednesday March 26 2024 01:50:45 EDT Ventricular Rate:  81 PR Interval:  137 QRS Duration:  91 QT Interval:  374 QTC Calculation: 435 R Axis:   11  Text Interpretation: Sinus rhythm RSR' in V1 or V2, right VCD or RVH Confirmed by Kelsey Patricia [id number]) on 03/26/2024 1:58:20 AM  Radiology CT Angio Chest/Abd/Pel for Dissection W and/or W/WO Result Date: 03/26/2024 CLINICAL DATA:  Left chest pain for a week. Constipation for 10 days. Left lower quadrant pain. Leukocytosis. Acute aortic syndrome suspected. EXAM: CT ANGIOGRAPHY CHEST, ABDOMEN AND PELVIS TECHNIQUE: Non-contrast CT of the chest was initially obtained. Multidetector CT imaging through the chest, abdomen and pelvis was performed using the standard protocol during bolus administration of intravenous contrast. Multiplanar reconstructed images and MIPs were obtained and reviewed to evaluate the vascular anatomy. RADIATION DOSE REDUCTION: This exam was performed according to the departmental dose-optimization program which includes automated exposure control, adjustment of the mA and/or kV according to patient size and/or use of iterative reconstruction technique. CONTRAST:   OMNIPAQUE IOHEXOL 350 MG/ML SOLN COMPARISON:  Abdomen and pelvis CT 05/19/2021 FINDINGS: CTA CHEST FINDINGS Cardiovascular: Pre contrast imaging shows no hyperdense crescent in the wall of the thoracic aorta to suggest the presence of an acute intramural hematoma. Moderate to advanced atherosclerotic disease noted in the thoracic aorta without aneurysm. No dissection of the thoracic aorta pulmonary arteries are well opacified without evidence of an acute pulmonary embolus. No substantial pericardial effusion. Mediastinum/Nodes: No mediastinal lymphadenopathy. There is no hilar lymphadenopathy. The esophagus has normal imaging features. There is no axillary lymphadenopathy. Lungs/Pleura: Centrilobular and paraseptal emphysema evident. Dependent atelectasis noted right lower lobe. 3 mm ground-glass nodule identified  left apex on 24/6. Calcified granuloma noted in the left upper lobe with calcified granulomata visible in the left lower lobe is well. No overtly suspicious pulmonary nodule or mass. No focal airspace consolidation. No pleural effusion. Musculoskeletal: No worrisome lytic or sclerotic osseous abnormality. Review of the MIP images confirms the above findings. CTA ABDOMEN AND PELVIS FINDINGS VASCULAR Aorta: Normal caliber aorta without aneurysm, dissection, vasculitis or significant stenosis. Moderate changes of atherosclerosis. Celiac: Patent without evidence of aneurysm, dissection, vasculitis or significant stenosis. Variant anatomy with common trunk of the celiac axis and SMA. SMA: Patent without evidence of aneurysm, dissection, vasculitis or significant stenosis. Variant anatomy with common trunk of the celiac axis and SMA. Renals: Both renal arteries are patent without evidence of aneurysm, dissection, vasculitis, fibromuscular dysplasia or significant stenosis. IMA: Patent without evidence of aneurysm, dissection, vasculitis or significant stenosis. Inflow: Patent without evidence of aneurysm,  dissection, vasculitis or significant stenosis. Veins: No obvious venous abnormality within the limitations of this arterial phase study. Review of the MIP images confirms the above findings. NON-VASCULAR Hepatobiliary: Cystic lesion in the posterior right hepatic lobe has increased in size in the interval measuring 11.9 x 11.5 cm today compared to 9.6 x 9.1 cm previously. Average attenuation of the contents of this cystic lesion have increased in the interval. The margins of the lesion become more ill-defined since prior with some irregularity no seen along the posterosuperior margin and an area of adjacent hypo attenuation in the liver parenchyma adjacent to the anterior margin of the lesion (image 108/10). Additional scattered hepatic cysts are evident with still more tiny low-density liver lesions that are too small to characterize but likely benign. Cholecystectomy. Common bile duct measures 14 mm on today's study, slightly increased from 12 mm previously with gradual tapering into the ampulla. Pancreas: No focal mass lesion. No dilatation of the main duct. No intraparenchymal cyst. No peripancreatic edema. Spleen: No splenomegaly. No suspicious focal mass lesion. Adrenals/Urinary Tract: No adrenal nodule or mass. Kidneys unremarkable. No evidence for hydroureter. The urinary bladder appears normal for the degree of distention. Stomach/Bowel: Stomach is unremarkable. No gastric wall thickening. No evidence of outlet obstruction. Duodenum is normally positioned as is the ligament of Treitz. No small bowel wall thickening. No small bowel dilatation. The terminal ileum is normal. The appendix is normal. No gross colonic mass. No colonic wall thickening. Diverticuli are seen scattered along the entire length of the colon without CT findings of diverticulitis. Lymphatic: There is no gastrohepatic or hepatoduodenal ligament lymphadenopathy. No retroperitoneal or mesenteric lymphadenopathy. No pelvic sidewall  lymphadenopathy. Reproductive: Prostate gland is enlarged. Other: Trace free fluid is seen in the pelvis (255/10). Musculoskeletal: No worrisome lytic or sclerotic osseous abnormality. Review of the MIP images confirms the above findings. IMPRESSION: 1. No evidence for thoracoabdominal aortic aneurysm or dissection. 2. No evidence for acute pulmonary embolus. 3. Interval increase in size of a large cystic lesion in the posterior right hepatic lobe with increased attenuation of the contents. The margins of the lesion have become more ill-defined since prior with some irregularity now seen along the posterosuperior margin and an area of adjacent hypo attenuation in the liver parenchyma adjacent to the anterior margin of the lesion. Features could reflect hemorrhage into this cyst. Given the history of leukocytosis, superinfection of a large hepatic cyst is also a consideration. 4. Trace free fluid in the pelvis. 5. Colonic diverticulosis without diverticulitis. 6. 3 mm ground-glass nodule left apex. No follow-up recommended. This recommendation follows the consensus  statement: Guidelines for Management of Incidental Pulmonary Nodules Detected on CT Images: From the Fleischner Society 2017; Radiology 2017; 284:228-243. 7.  Emphysema (ICD10-J43.9) and Aortic Atherosclerosis (ICD10-170.0) Electronically Signed   By: Donnal Fusi M.D.   On: 03/26/2024 06:19   DG Chest 2 View Result Date: 03/26/2024 CLINICAL DATA:  Increasing chest pain EXAM: CHEST - 2 VIEW COMPARISON:  01/19/2017 FINDINGS: Cardiac shadow is stable. Mild emphysematous changes and fibrotic changes seen. Scattered calcified granulomas are noted within the left lung stable from the prior study. No bony abnormality is noted. IMPRESSION: Chronic emphysematous changes with fibrosis. No acute abnormality noted. Electronically Signed   By: Violeta Grey M.D.   On: 03/26/2024 02:30    Procedures Procedures    Medications Ordered in ED Medications   sodium chloride 0.9 % bolus 500 mL (0 mLs Intravenous Stopped 03/26/24 0412)  fentaNYL (SUBLIMAZE) injection 50 mcg (50 mcg Intravenous Given 03/26/24 0321)  ondansetron (ZOFRAN) injection 4 mg (4 mg Intravenous Given 03/26/24 0322)  iohexol (OMNIPAQUE) 350 MG/ML injection 100 mL (100 mLs Intravenous Contrast Given 03/26/24 0514)  piperacillin-tazobactam (ZOSYN) IVPB 3.375 g (3.375 g Intravenous New Bag/Given 03/26/24 0702)  fentaNYL (SUBLIMAZE) injection 50 mcg (50 mcg Intravenous Given 03/26/24 0700)    ED Course/ Medical Decision Making/ A&P                                 Medical Decision Making Amount and/or Complexity of Data Reviewed Labs: ordered. Radiology: ordered.  Risk Prescription drug management. Decision regarding hospitalization.   Pt with hx/o HTN, HPL here with left sided chest pain, abdominal pain.  Language barrier limits hx - interpreter used.  EKG is nonischemic and troponins are negative.  Pt does not have significant abdominal tenderness, but given sxs of chest pain, abdominal pain and significant leukocytosis a CTA was obtained, with is significant for enlarging hepatic cyst, concerning for possible infection vs internal hemorrhage.  Will start on abx given these findings.  Lactic acid is wnl.  Pt reports subjective fevers although is afebrile here.  Discussed with pt findings of studies and recommendation for additional evaluation in the hospital and he is in agreement with plan.  Hospitalist consulted for admission for ongoing care.           Final Clinical Impression(s) / ED Diagnoses Final diagnoses:  Precordial pain  Liver lesion  Leukocytosis, unspecified type    Rx / DC Orders ED Discharge Orders     None         Kelsey Patricia, MD 03/26/24 [phone]

## 2024-03-26 NOTE — H&P (Signed)
 History and Physical    Patient: Jamie Short ZOX:[id number] DOB: [birth year] DOA: 03/26/2024 DOS: the patient was seen and examined on 03/26/2024 PCP: Patient, No Pcp Per  Patient coming from: Home  Chief Complaint:  Chief Complaint  Patient presents with   Chest Pain   Abdominal Pain   HPI: Jamie Short is a 73 y.o. male with medical history significant of osteoarthritis, BPH, urinary retention who presented to the emergency department complaints of chest and RUQ sharp abdominal pain that has been getting progressively worse for the past week.  He was seen at [facility] health urgent care and was recommended to return to the ER if chest pain persistent.  He has been constipated also for the past 10 days.  He has had chills, fatigue and malaise.  He was given tramadol by his PCP without significant relief.  He feels constipated, but had a small bowel movement yesterday evening.  No  nausea, emesis, diarrhea, melena or hematochezia.  No flank pain, dysuria, frequency or hematuria. He denied fever, chills, rhinorrhea, sore throat, wheezing or hemoptysis.  No chest pain, palpitations, diaphoresis, PND, orthopnea or pitting edema of the lower extremities.    Imaging: 2 view chest radiograph with chron emphysematous change with fibrosis.  No abnormalities seen.  CT chest/abdomen/pelvis dissection study no evidence of aneurysm or dissection.  No evidence of PE.  Interval increase in size of a large cystic lesion in the posterior right hepatic lobe with increased attenuation of contents.  Trace free fluid in the pelvis.  Colonic diverticulosis without diverticulitis.  3 mm left apex groundglass nodule.  Emphysema.  Please see images in full detailed report for further details.  ED course: Initial vital signs were temperature 97.4 F, pulse 83, respiration 15, BP 117/66 mmHg and O2 sat 97% on room air.  The patient was started on Zosyn in the emergency department, given fentanyl 50 mcg IVP x 3, ondansetron 4 mg IVP x  1 and normal saline 500 mL bolus.   Lab work: Urinalysis was normal.  CBC showed white count 22.1, hemoglobin 12.6 g/dL and platelets 811.  Normal PT and INR.  Lipase, and troponin level were unremarkable.  Lactic acid only 0.4 mmol/L.  CMP showed a sodium 124 mmol/L, glucose 121, BUN 24 and creatinine 0.93 mg/dL.  LFTs were normal with the exception of an albumin level 2.8 g/dL.   Review of Systems: As mentioned in the history of present illness. All other systems reviewed and are negative.  Past Medical History:  Diagnosis Date   Arthritis    lower legs   BPH (benign prostatic hyperplasia)    Foley catheter in place    Urinary retention    Wears dentures    Wears glasses    Past Surgical History:  Procedure Laterality Date   COLONOSCOPY WITH PROPOFOL N/A 02/26/2020   Procedure: COLONOSCOPY WITH PROPOFOL;  Surgeon: Beverley Fiedler, MD;  Location: [facility];  Service: Gastroenterology;  Laterality: N/A;   LAPAROSCOPIC CHOLECYSTECTOMY  2006 approx   TRANSURETHRAL RESECTION OF PROSTATE N/A 10/18/2015   Procedure: TRANSURETHRAL RESECTION OF THE PROSTATE WITH GYRUS INSTRUMENTS WITH RESECTION OF BLADDER WALL TUMOR;  Surgeon: Malen Gauze, MD;  Location: [facility];  Service: Urology;  Laterality: N/A;   Social History:  reports that he has quit smoking. His smoking use included cigarettes. He has a 20 pack-year smoking history. He has never used smokeless tobacco. He reports that he does not drink alcohol and does not  use drugs.  No Known Allergies  No family history on file.  Prior to Admission medications   Medication Sig Start Date End Date Taking? Authorizing Provider  acetaminophen (TYLENOL) 325 MG tablet Take 650 mg by mouth every 6 (six) hours as needed for mild pain or headache.    [provider]  finasteride (PROSCAR) 5 MG tablet Take 5 mg by mouth daily. 02/20/24   [provider]  gabapentin (NEURONTIN) 300 MG capsule Take 300 mg by mouth  3 (three) times daily. 01/27/20   [provider]  Glucosamine HCl (GLUCOSAMINE PO) Take 1 tablet by mouth 2 (two) times daily.    [provider]  lisinopril-hydrochlorothiazide (ZESTORETIC) 20-25 MG tablet Take 0.5 tablets by mouth daily. 01/14/24   [provider]  lovastatin (MEVACOR) 20 MG tablet Take 20 mg by mouth at bedtime. 01/27/20   [provider]  omeprazole (PRILOSEC) 40 MG capsule Take 1 capsule (40 mg total) by mouth daily for 5 days. 03/18/24 03/23/24  Zenia Resides, MD  simvastatin (ZOCOR) 20 MG tablet Take 20 mg by mouth at bedtime. 02/12/24   [provider]  traMADol (ULTRAM) 50 MG tablet Take 1 tablet (50 mg total) by mouth every 6 (six) hours as needed (pain). 03/18/24   Zenia Resides, MD    Physical Exam: Vitals:   03/26/24 0155 03/26/24 0159 03/26/24 0235 03/26/24 0626  BP:   (!) 110/59 119/69  Pulse: 83  75 73  Resp: 15  12 18   Temp:  (!) 97.4 F (36.3 C)  97.8 F (36.6 C)  TempSrc:  Oral    SpO2: 97%  97% 98%   Physical Exam Vitals and nursing note reviewed.  Constitutional:      Appearance: He is well-developed. He is ill-appearing.  HENT:     Head: Normocephalic.     Nose: No rhinorrhea.  Eyes:     General: No scleral icterus.    Pupils: Pupils are equal, round, and reactive to light.  Neck:     Vascular: No JVD.  Cardiovascular:     Rate and Rhythm: Normal rate and regular rhythm.     Heart sounds: S1 normal and S2 normal.  Pulmonary:     Breath sounds: No decreased breath sounds, wheezing, rhonchi or rales.  Abdominal:     General: Bowel sounds are normal.     Palpations: Abdomen is soft.     Tenderness: There is abdominal tenderness. There is no right CVA tenderness or left CVA tenderness.  Musculoskeletal:     Cervical back: Neck supple.     Right lower leg: No edema.     Left lower leg: No edema.  Skin:    General: Skin is warm and dry.  Neurological:     General: No focal deficit present.      Mental Status: He is alert and oriented to person, place, and time.  Psychiatric:        Mood and Affect: Mood normal.        Behavior: Behavior normal.     Data Reviewed:  Results are pending, will review when available. EKG: Vent. rate 81 BPM PR interval 137 ms QRS duration 91 ms QT/QTcB 374/435 ms P-R-T axes 58 11 25 Sinus rhythm RSR' in V1 or V2, right VCD or RVH  Assessment and Plan: Principal Problem:   SIRS (systemic inflammatory response syndrome) (HCC) In the setting of:     Hepatic cyst With associated:   Chest And:  Abdominal pain Enlarged cyst drained by IR. Observation/MedSurg. Continue IV fluids. Analgesics as needed. Antiemetics as needed. Continue Zosyn 3.375 g IVPB daily. Will follow fluid culture.  Active Problems:   BPH (benign prostatic hyperplasia) Continue finasteride 5 mg p.o. daily.    Hyperlipidemia Continue simvastatin 20 mg p.o. at bedtime.    Moderate protein malnutrition (HCC) In the setting of anemia and malignancy. May benefit from protein supplementation. Consider nutritional services evaluation. Follow-up albumin level.    Normocytic anemia Monitor hematocrit and hemoglobin.     Advance Care Planning:   Code Status: Full Code   Consults: Interventional radiology Art Largo, MD)  Family Communication:   Severity of Illness: The appropriate patient status for this patient is INPATIENT. Inpatient status is judged to be reasonable and necessary in order to provide the required intensity of service to ensure the patient's safety. The patient's presenting symptoms, physical exam findings, and initial radiographic and laboratory data in the context of their chronic comorbidities is felt to place them at high risk for further clinical deterioration. Furthermore, it is not anticipated that the patient will be medically stable for discharge from the hospital within 2 midnights of admission.   * I certify that at the point  of admission it is my clinical judgment that the patient will require inpatient hospital care spanning beyond 2 midnights from the point of admission due to high intensity of service, high risk for further deterioration and high frequency of surveillance required.*  Author: Danice Dural, MD 03/26/2024 7:08 AM  For on call review [email or web address].   This document was prepared using Dragon voice recognition software and may contain some unintended transcription errors.

## 2024-03-26 NOTE — Procedures (Signed)
 Vascular and Interventional Radiology Procedure Note  Patient: Jamie Short DOB: Apr 12, 1951 Medical Record Number: [id number] Note Date/Time: 03/26/24 12:40 PM   Performing Physician: Art Largo, MD Assistant(s): None  Diagnosis:  Large R hepatic cyst . Q superinfected  Procedure: ASPIRATION OF HEPATIC CYST  Anesthesia: Local Anesthetic Complications: None Estimated Blood Loss: Minimal Specimens:  Sent Gram Stain, Aerobe Culture, Anerobe Culture, and Cytology  Findings:  The RIGHT Upper quadrant peritoneal space was accessed with a Yueh Needle, and 450 mL of fluid was obtained. Large R hepatic cyst  aspirate   See detailed procedure note with images in PACS. The patient tolerated the procedure well without incident or complication and was returned to Recovery in stable condition.    Art Largo, MD [facility] Radiology   Pager. [phone] Clinic. [phone]

## 2024-03-26 NOTE — Consult Note (Addendum)
 Chief Complaint: Abdominal/chest pain, body aches, hepatic cyst; referred for image guided hepatic cyst aspiration  Referring Provider(s): Ortiz,D  Supervising Physician: Roanna Banning  Patient Status: [facility] - In-pt  History of Present Illness: Jamie Short is a 73 y.o. male ex-smoker with past medical history of arthritis, BPH, emphysema, hypertension and diverticulosis who presented to [facility], ED today with chest and abdominal discomfort, body aches and feeling feverish with constipation.  Chest x-ray revealed chronic emphysematous changes with fibrosis, no acute abnormality.CT angiography of the chest abdomen pelvis revealed:  1. No evidence for thoracoabdominal aortic aneurysm or dissection. 2. No evidence for acute pulmonary embolus. 3. Interval increase in size of a large cystic lesion in the posterior right hepatic lobe with increased attenuation of the contents. The margins of the lesion have become more ill-defined since prior with some irregularity now seen along the posterosuperior margin and an area of adjacent hypo attenuation in the liver parenchyma adjacent to the anterior margin of the lesion. Features could reflect hemorrhage into this cyst. Given the history of leukocytosis, superinfection of a large hepatic cyst is also a consideration. 4. Trace free fluid in the pelvis. 5. Colonic diverticulosis without diverticulitis. 6. 3 mm ground-glass nodule left apex. No follow-up recommended. This recommendation follows the consensus statement: Guidelines for Management of Incidental Pulmonary Nodules Detected on CT Images: From the Fleischner Society 2017; Radiology 2017; 284:228-243. 7.  Emphysema (ICD10-J43.9) and Aortic Atherosclerosis    Patient currently afebrile, WBC 22.1, hemoglobin 12.6, platelets 466k, creatinine 0.93, blood cx neg to date, PT/INR pend; request now received for image guided aspiration of hepatic cyst    Patient is Full Code  Past Medical  History:  Diagnosis Date   Arthritis    lower legs   BPH (benign prostatic hyperplasia)    Foley catheter in place    Urinary retention    Wears dentures    Wears glasses     Past Surgical History:  Procedure Laterality Date   COLONOSCOPY WITH PROPOFOL N/A 02/26/2020   Procedure: COLONOSCOPY WITH PROPOFOL;  Surgeon: Beverley Fiedler, MD;  Location: [facility];  Service: Gastroenterology;  Laterality: N/A;   LAPAROSCOPIC CHOLECYSTECTOMY  2006 approx   TRANSURETHRAL RESECTION OF PROSTATE N/A 10/18/2015   Procedure: TRANSURETHRAL RESECTION OF THE PROSTATE WITH GYRUS INSTRUMENTS WITH RESECTION OF BLADDER WALL TUMOR;  Surgeon: Malen Gauze, MD;  Location: [facility];  Service: Urology;  Laterality: N/A;    Allergies: Patient has no known allergies.  Medications: Prior to Admission medications   Medication Sig Start Date End Date Taking? Authorizing Provider  acetaminophen (TYLENOL) 325 MG tablet Take 650 mg by mouth every 6 (six) hours as needed for mild pain or headache.    [provider]  finasteride (PROSCAR) 5 MG tablet Take 5 mg by mouth daily. 02/20/24   [provider]  gabapentin (NEURONTIN) 300 MG capsule Take 300 mg by mouth 3 (three) times daily. 01/27/20   [provider]  Glucosamine HCl (GLUCOSAMINE PO) Take 1 tablet by mouth 2 (two) times daily.    [provider]  lisinopril-hydrochlorothiazide (ZESTORETIC) 20-25 MG tablet Take 0.5 tablets by mouth daily. 01/14/24   [provider]  omeprazole (PRILOSEC) 40 MG capsule Take 1 capsule (40 mg total) by mouth daily for 5 days. 03/18/24 03/23/24  Zenia Resides, MD  simvastatin (ZOCOR) 20 MG tablet Take 20 mg by mouth at bedtime. 02/12/24   [provider]  traMADol (ULTRAM) 50 MG  tablet Take 1 tablet (50 mg total) by mouth every 6 (six) hours as needed (pain). 03/18/24   Ann Keto, MD     No family history on file.  Social History    Socioeconomic History   Marital status: Single    Spouse name: Not on file   Number of children: Not on file   Years of education: Not on file   Highest education level: Not on file  Occupational History   Not on file  Tobacco Use   Smoking status: Former    Current packs/day: 0.50    Average packs/day: 0.5 packs/day for 40.0 years (20.0 ttl pk-yrs)    Types: Cigarettes   Smokeless tobacco: Never  Vaping Use   Vaping status: Never Used  Substance and Sexual Activity   Alcohol use: No   Drug use: No   Sexual activity: Not Currently  Other Topics Concern   Not on file  Social History Narrative   Not on file   Social Drivers of Health   Financial Resource Strain: Not on file  Food Insecurity: Not on file  Transportation Needs: Not on file  Physical Activity: Not on file  Stress: Not on file  Social Connections: Not on file       Review of Systems see above; currently denies fever, headache, respiratory issues, nausea, vomiting or bleeding  Vital Signs: BP 119/63 (BP Location: Left Arm)   Pulse 74   Temp 97.8 F (36.6 C)   Resp 18   SpO2 99%   Advance Care Plan: No documents on file   Physical Exam awake, alert.  Chest -distant but clear breath sounds bilaterally.  Heart with regular rate and rhythm.  Abdomen soft, positive bowel sounds, some mild diffuse tenderness to palpation.  No lower extremity edema.  Imaging: CT Angio Chest/Abd/Pel for Dissection W and/or W/WO Result Date: 03/26/2024 CLINICAL DATA:  Left chest pain for a week. Constipation for 10 days. Left lower quadrant pain. Leukocytosis. Acute aortic syndrome suspected. EXAM: CT ANGIOGRAPHY CHEST, ABDOMEN AND PELVIS TECHNIQUE: Non-contrast CT of the chest was initially obtained. Multidetector CT imaging through the chest, abdomen and pelvis was performed using the standard protocol during bolus administration of intravenous contrast. Multiplanar reconstructed images and MIPs were obtained and reviewed  to evaluate the vascular anatomy. RADIATION DOSE REDUCTION: This exam was performed according to the departmental dose-optimization program which includes automated exposure control, adjustment of the mA and/or kV according to patient size and/or use of iterative reconstruction technique. CONTRAST:  OMNIPAQUE IOHEXOL 350 MG/ML SOLN COMPARISON:  Abdomen and pelvis CT 05/19/2021 FINDINGS: CTA CHEST FINDINGS Cardiovascular: Pre contrast imaging shows no hyperdense crescent in the wall of the thoracic aorta to suggest the presence of an acute intramural hematoma. Moderate to advanced atherosclerotic disease noted in the thoracic aorta without aneurysm. No dissection of the thoracic aorta pulmonary arteries are well opacified without evidence of an acute pulmonary embolus. No substantial pericardial effusion. Mediastinum/Nodes: No mediastinal lymphadenopathy. There is no hilar lymphadenopathy. The esophagus has normal imaging features. There is no axillary lymphadenopathy. Lungs/Pleura: Centrilobular and paraseptal emphysema evident. Dependent atelectasis noted right lower lobe. 3 mm ground-glass nodule identified left apex on 24/6. Calcified granuloma noted in the left upper lobe with calcified granulomata visible in the left lower lobe is well. No overtly suspicious pulmonary nodule or mass. No focal airspace consolidation. No pleural effusion. Musculoskeletal: No worrisome lytic or sclerotic osseous abnormality. Review of the MIP images confirms the above findings. CTA ABDOMEN  AND PELVIS FINDINGS VASCULAR Aorta: Normal caliber aorta without aneurysm, dissection, vasculitis or significant stenosis. Moderate changes of atherosclerosis. Celiac: Patent without evidence of aneurysm, dissection, vasculitis or significant stenosis. Variant anatomy with common trunk of the celiac axis and SMA. SMA: Patent without evidence of aneurysm, dissection, vasculitis or significant stenosis. Variant anatomy with common trunk of  the celiac axis and SMA. Renals: Both renal arteries are patent without evidence of aneurysm, dissection, vasculitis, fibromuscular dysplasia or significant stenosis. IMA: Patent without evidence of aneurysm, dissection, vasculitis or significant stenosis. Inflow: Patent without evidence of aneurysm, dissection, vasculitis or significant stenosis. Veins: No obvious venous abnormality within the limitations of this arterial phase study. Review of the MIP images confirms the above findings. NON-VASCULAR Hepatobiliary: Cystic lesion in the posterior right hepatic lobe has increased in size in the interval measuring 11.9 x 11.5 cm today compared to 9.6 x 9.1 cm previously. Average attenuation of the contents of this cystic lesion have increased in the interval. The margins of the lesion become more ill-defined since prior with some irregularity no seen along the posterosuperior margin and an area of adjacent hypo attenuation in the liver parenchyma adjacent to the anterior margin of the lesion (image 108/10). Additional scattered hepatic cysts are evident with still more tiny low-density liver lesions that are too small to characterize but likely benign. Cholecystectomy. Common bile duct measures 14 mm on today's study, slightly increased from 12 mm previously with gradual tapering into the ampulla. Pancreas: No focal mass lesion. No dilatation of the main duct. No intraparenchymal cyst. No peripancreatic edema. Spleen: No splenomegaly. No suspicious focal mass lesion. Adrenals/Urinary Tract: No adrenal nodule or mass. Kidneys unremarkable. No evidence for hydroureter. The urinary bladder appears normal for the degree of distention. Stomach/Bowel: Stomach is unremarkable. No gastric wall thickening. No evidence of outlet obstruction. Duodenum is normally positioned as is the ligament of Treitz. No small bowel wall thickening. No small bowel dilatation. The terminal ileum is normal. The appendix is normal. No gross  colonic mass. No colonic wall thickening. Diverticuli are seen scattered along the entire length of the colon without CT findings of diverticulitis. Lymphatic: There is no gastrohepatic or hepatoduodenal ligament lymphadenopathy. No retroperitoneal or mesenteric lymphadenopathy. No pelvic sidewall lymphadenopathy. Reproductive: Prostate gland is enlarged. Other: Trace free fluid is seen in the pelvis (255/10). Musculoskeletal: No worrisome lytic or sclerotic osseous abnormality. Review of the MIP images confirms the above findings. IMPRESSION: 1. No evidence for thoracoabdominal aortic aneurysm or dissection. 2. No evidence for acute pulmonary embolus. 3. Interval increase in size of a large cystic lesion in the posterior right hepatic lobe with increased attenuation of the contents. The margins of the lesion have become more ill-defined since prior with some irregularity now seen along the posterosuperior margin and an area of adjacent hypo attenuation in the liver parenchyma adjacent to the anterior margin of the lesion. Features could reflect hemorrhage into this cyst. Given the history of leukocytosis, superinfection of a large hepatic cyst is also a consideration. 4. Trace free fluid in the pelvis. 5. Colonic diverticulosis without diverticulitis. 6. 3 mm ground-glass nodule left apex. No follow-up recommended. This recommendation follows the consensus statement: Guidelines for Management of Incidental Pulmonary Nodules Detected on CT Images: From the Fleischner Society 2017; Radiology 2017; 284:228-243. 7.  Emphysema (ICD10-J43.9) and Aortic Atherosclerosis (ICD10-170.0) Electronically Signed   By: Donnal Fusi M.D.   On: 03/26/2024 06:19   DG Chest 2 View Result Date: 03/26/2024 CLINICAL DATA:  Increasing chest  pain EXAM: CHEST - 2 VIEW COMPARISON:  01/19/2017 FINDINGS: Cardiac shadow is stable. Mild emphysematous changes and fibrotic changes seen. Scattered calcified granulomas are noted within the left  lung stable from the prior study. No bony abnormality is noted. IMPRESSION: Chronic emphysematous changes with fibrosis. No acute abnormality noted. Electronically Signed   By: Alcide Clever M.D.   On: 03/26/2024 02:30    Labs:  CBC: Recent Labs    07/13/23 0914 03/26/24 0208  WBC 12.1* 22.1*  HGB 15.1 12.6*  HCT 47.0 38.2*  PLT 311 466*    COAGS: No results for input(s): "INR", "APTT" in the last 8760 hours.  BMP: Recent Labs    07/13/23 0914 03/26/24 0208  NA 137 134*  K 3.9 4.1  CL 103 100  CO2 25 26  GLUCOSE 90 121*  BUN 28* 24*  CALCIUM 9.0 8.3*  CREATININE 1.10 0.93  GFRNONAA >60 >60    LIVER FUNCTION TESTS: Recent Labs    03/26/24 0208  BILITOT 0.8  AST 15  ALT 30  ALKPHOS 110  PROT 6.6  ALBUMIN 2.8*    TUMOR MARKERS: No results for input(s): "AFPTM", "CEA", "CA199", "CHROMGRNA" in the last 8760 hours.  Assessment and Plan: 73 y.o. male ex-smoker with past medical history of arthritis, BPH, pancreatitis, emphysema, hypertension and diverticulosis who presented to [facility], ED today with chest and abdominal discomfort, body aches and feeling feverish with constipation.  Chest x-ray revealed chronic emphysematous changes with fibrosis, no acute abnormality.CT angiography of the chest abdomen pelvis revealed:  1. No evidence for thoracoabdominal aortic aneurysm or dissection. 2. No evidence for acute pulmonary embolus. 3. Interval increase in size of a large cystic lesion in the posterior right hepatic lobe with increased attenuation of the contents. The margins of the lesion have become more ill-defined since prior with some irregularity now seen along the posterosuperior margin and an area of adjacent hypo attenuation in the liver parenchyma adjacent to the anterior margin of the lesion. Features could reflect hemorrhage into this cyst. Given the history of leukocytosis, superinfection of a large hepatic cyst is also a consideration. 4. Trace  free fluid in the pelvis. 5. Colonic diverticulosis without diverticulitis. 6. 3 mm ground-glass nodule left apex. No follow-up recommended. This recommendation follows the consensus statement: Guidelines for Management of Incidental Pulmonary Nodules Detected on CT Images: From the Fleischner Society 2017; Radiology 2017; 284:228-243. 7.  Emphysema (ICD10-J43.9) and Aortic Atherosclerosis    Patient currently afebrile, WBC 22.1, hemoglobin 12.6, platelets 466k, creatinine 0.93, blood cx neg to date, PT/INR pend; patient is on IV Zosyn.  Request now received for image guided aspiration of hepatic cyst; imaging studies have been reviewed by Dr. Milford Cage. Risks and benefits of procedure was discussed with the patient via interpreter including, but not limited to bleeding, infection, damage to adjacent structures. All of the questions were answered and there is agreement to proceed.  Consent signed and in chart.  Procedure tentatively planned for later today.    Thank you for allowing our service to participate in Jamie Short 's care.  Electronically Signed: D. Jeananne Rama, PA-C   03/26/2024, 10:09 AM      I spent a total of 40 Minutes    in face to face in clinical consultation, greater than 50% of which was counseling/coordinating care for image guided hepatic cyst aspiration

## 2024-03-26 NOTE — ED Triage Notes (Signed)
 Pt reports intermittent left sided chest pain x 1 week. No radiation. Pain described sharp, rating 7/10, and associated with dizziness. Reports some relief of pain with trazodone. Denies cardiac hx. No smoking. Hx HTN and high cholesterol. Was seen at [facility] - recommended ED visit if CP persisted.    Also sts concern for constipation x 10 days. Reports lower abdominal pain and LUQ pain. Last BP, small, yesterday 4/15 at 10 pm. No NVD.   [place] Journalist, newspaper Used  during [employer] [phone]

## 2024-03-26 NOTE — Plan of Care (Signed)
  Problem: Nutrition: Goal: Adequate nutrition will be maintained Outcome: Progressing   Problem: Elimination: Goal: Will not experience complications related to bowel motility Outcome: Progressing Goal: Will not experience complications related to urinary retention Outcome: Progressing   Problem: Pain Managment: Goal: General experience of comfort will improve and/or be controlled Outcome: Progressing   Problem: Safety: Goal: Ability to remain free from injury will improve Outcome: Progressing   Problem: Skin Integrity: Goal: Risk for impaired skin integrity will decrease Outcome: Progressing

## 2024-03-27 DIAGNOSIS — R6510 Systemic inflammatory response syndrome (SIRS) of non-infectious origin without acute organ dysfunction: Secondary | ICD-10-CM | POA: Diagnosis not present

## 2024-03-27 LAB — COMPREHENSIVE METABOLIC PANEL WITH GFR
ALT: 27 U/L (ref 0–44)
AST: 17 U/L (ref 15–41)
Albumin: 2.5 g/dL — ABNORMAL LOW (ref 3.5–5.0)
Alkaline Phosphatase: 97 U/L (ref 38–126)
Anion gap: 8 (ref 5–15)
BUN: 17 mg/dL (ref 8–23)
CO2: 23 mmol/L (ref 22–32)
Calcium: 7.9 mg/dL — ABNORMAL LOW (ref 8.9–10.3)
Chloride: 104 mmol/L (ref 98–111)
Creatinine, Ser: 0.95 mg/dL (ref 0.61–1.24)
GFR, Estimated: >60 mL/min (ref >60)
Glucose, Bld: 118 mg/dL — ABNORMAL HIGH (ref 70–99)
Potassium: 3.8 mmol/L (ref 3.5–5.1)
Sodium: 135 mmol/L (ref 135–145)
Total Bilirubin: 0.7 mg/dL (ref 0.0–1.2)
Total Protein: 6.1 g/dL — ABNORMAL LOW (ref 6.5–8.1)

## 2024-03-27 LAB — CBC
HCT: 39.2 % (ref 39.0–52.0)
Hemoglobin: 12.7 g/dL — ABNORMAL LOW (ref 13.0–17.0)
MCH: 31.6 pg (ref 26.0–34.0)
MCHC: 32.4 g/dL (ref 30.0–36.0)
MCV: 97.5 fL (ref 80.0–100.0)
Platelets: 439 10*3/uL — ABNORMAL HIGH (ref 150–400)
RBC: 4.02 MIL/uL — ABNORMAL LOW (ref 4.22–5.81)
RDW: 13.6 % (ref 11.5–15.5)
WBC: 13.3 10*3/uL — ABNORMAL HIGH (ref 4.0–10.5)
nRBC: 0 % (ref 0.0–0.2)

## 2024-03-27 MED ORDER — POLYETHYLENE GLYCOL 3350 17 G PO PACK
17.0000 g | PACK | Freq: Every day | ORAL | Status: DC | PRN
  Administered 2024-03-27: 17 g via ORAL
  Filled 2024-03-27 (×3): qty 1

## 2024-03-27 NOTE — Plan of Care (Signed)

## 2024-03-27 NOTE — Progress Notes (Signed)
 Mobility Specialist - Progress Note   03/27/24 1321  Mobility  Activity Ambulated independently in hallway  Level of Assistance Independent  Assistive Device None  Distance Ambulated (ft) 500 ft  Activity Response Tolerated well  Mobility Referral Yes  Mobility visit 1 Mobility  Mobility Specialist Start Time (ACUTE ONLY) 1308  Mobility Specialist Stop Time (ACUTE ONLY) 1321  Mobility Specialist Time Calculation (min) (ACUTE ONLY) 13 min   Pt received in bed and agreeable to mobility. No complaints during session. Pt to bed after session with all needs met.    [facility]

## 2024-03-27 NOTE — Progress Notes (Signed)
 PROGRESS NOTE    Jamie Short  VWU:[id number] DOB: 05/18/1951 DOA: 03/26/2024 PCP: Patient, No Pcp Per   Brief Narrative: 73 y.o. male with medical history significant of osteoarthritis, BPH, urinary retention who presented to the emergency department complaints of chest and RUQ sharp abdominal pain that has been getting progressively worse for the past week.  He was seen at [facility] urgent care and was recommended to return to the ER if chest pain persistent.  He has been constipated also for the past 10 days.  He has had chills, fatigue and malaise.  He was given tramadol by his PCP without significant relief.  He feels constipated, but had a small bowel movement yesterday evening.  No  nausea, emesis, diarrhea, melena or hematochezia.  No flank pain, dysuria, frequency or hematuria. He denied fever, chills, rhinorrhea, sore throat, wheezing or hemoptysis.  No chest pain, palpitations, diaphoresis, PND, orthopnea or pitting edema of the lower extremities.     Imaging: 2 view chest radiograph with chron emphysematous change with fibrosis.  No abnormalities seen.  CT chest/abdomen/pelvis dissection study no evidence of aneurysm or dissection.  No evidence of PE.  Interval increase in size of a large cystic lesion in the posterior right hepatic lobe with increased attenuation of contents.  Trace free fluid in the pelvis.  Colonic diverticulosis without diverticulitis.  3 mm left apex groundglass nodule.  Emphysema.  Please see images in full detailed report for further details.   ED course: Initial vital signs were temperature 97.4 F, pulse 83, respiration 15, BP 117/66 mmHg and O2 sat 97% on room air.  The patient was started on Zosyn in the emergency department, given fentanyl 50 mcg IVP x 3, ondansetron 4 mg IVP x 1 and normal saline 500 mL bolus.    Lab work: Urinalysis was normal.  CBC showed white count 22.1, hemoglobin 12.6 g/dL and platelets 295.  Normal PT and INR.  Lipase, and troponin level  were unremarkable.  Lactic acid only 0.4 mmol/L.  CMP showed a sodium 124 mmol/L, glucose 121, BUN 24 and creatinine 0.93 mg/dL.  LFTs were normal with the exception of an albumin level 2.8 g/dL.  Assessment & Plan:   Principal Problem:   SIRS (systemic inflammatory response syndrome) (HCC) Active Problems:   BPH (benign prostatic hyperplasia)   Hepatic cyst   Hyperlipidemia   Moderate protein malnutrition (HCC)   Normocytic anemia   Abdominal pain   Chest pain   Hepatic cyst -patient admitted with abdominal pain and chest pain was found to have a very enlarged liver cyst which has increased in size from 9 to almost 12 cm which was drained by interventional radiology.  Cultures are pending.  Cytology pending.  Continue Zosyn.     BPH (benign prostatic hyperplasia) Continue finasteride 5 mg p.o. daily.     Hyperlipidemia Continue simvastatin 20 mg p.o. at bedtime.   Estimated body mass index is 23.01 kg/m as calculated from the following:   Height as of this encounter: 5\' 4"  (1.626 m).   Weight as of this encounter: 60.8 kg.  DVT prophylaxis:scd Code Status: Full code Family Communication: Discussed with son over the phone Disposition Plan:  Status is: Inpatient Remains inpatient appropriate because: Acute illness   Consultants: Interventional radiology  Procedures: Hepatic cyst drainage by IR on 03/26/2024 Antimicrobials: Zosyn  Subjective: Resting in bed complains of pain at the site of drainage otherwise denies nausea vomiting chest pain  Objective: Vitals:   03/26/24 1700  03/26/24 1817 03/26/24 2038 03/27/24 0551  BP:  (!) 93/54 94/62 117/66  Pulse:  65 74 71  Resp:  18 18 18   Temp:  98.3 F (36.8 C) (!) 97.5 F (36.4 C) 97.7 F (36.5 C)  TempSrc:    Oral  SpO2:  100% 99% 99%  Weight: 60.8 kg     Height: 5\' 4"  (1.626 m)       Intake/Output Summary (Last 24 hours) at 03/27/2024 1505 Last data filed at 03/26/2024 1600 Gross per 24 hour  Intake 240 ml   Output --  Net 240 ml   Filed Weights   03/26/24 1222 03/26/24 1700  Weight: 60.8 kg 60.8 kg    Examination:  General exam: Appears in no distress Respiratory system: Clear to auscultation. Respiratory effort normal. Cardiovascular system: S1 & S2 heard, RRR. No JVD, murmurs, rubs, gallops or clicks. No pedal edema. Gastrointestinal system: Abdomen is nondistended, soft and nontender. No organomegaly or masses felt. Normal bowel sounds heard. Central nervous system: Alert and oriented. No focal neurological deficits. Extremities: No edema   Data Reviewed: I have personally reviewed following labs and imaging studies  CBC: Recent Labs  Lab 03/26/24 0208 03/27/24 0609  WBC 22.1* 13.3*  HGB 12.6* 12.7*  HCT 38.2* 39.2  MCV 96.5 97.5  PLT 466* 439*   Basic Metabolic Panel: Recent Labs  Lab 03/26/24 0208 03/27/24 0609  NA 134* 135  K 4.1 3.8  CL 100 104  CO2 26 23  GLUCOSE 121* 118*  BUN 24* 17  CREATININE 0.93 0.95  CALCIUM 8.3* 7.9*   GFR: Estimated Creatinine Clearance: 57.1 mL/min (by C-G formula based on SCr of 0.95 mg/dL). Liver Function Tests: Recent Labs  Lab 03/26/24 0208 03/27/24 0609  AST 15 17  ALT 30 27  ALKPHOS 110 97  BILITOT 0.8 0.7  PROT 6.6 6.1*  ALBUMIN 2.8* 2.5*   Recent Labs  Lab 03/26/24 0208  LIPASE 30   No results for input(s): "AMMONIA" in the last 168 hours. Coagulation Profile: Recent Labs  Lab 03/26/24 1053  INR 1.0   Cardiac Enzymes: No results for input(s): "CKTOTAL", "CKMB", "CKMBINDEX", "TROPONINI" in the last 168 hours. BNP (last 3 results) No results for input(s): "PROBNP" in the last 8760 hours. HbA1C: No results for input(s): "HGBA1C" in the last 72 hours. CBG: No results for input(s): "GLUCAP" in the last 168 hours. Lipid Profile: No results for input(s): "CHOL", "HDL", "LDLCALC", "TRIG", "CHOLHDL", "LDLDIRECT" in the last 72 hours. Thyroid Function Tests: No results for input(s): "TSH", "T4TOTAL",  "FREET4", "T3FREE", "THYROIDAB" in the last 72 hours. Anemia Panel: No results for input(s): "VITAMINB12", "FOLATE", "FERRITIN", "TIBC", "IRON", "RETICCTPCT" in the last 72 hours. Sepsis Labs: Recent Labs  Lab 03/26/24 0331  LATICACIDVEN 0.4*    Recent Results (from the past 240 hours)  Culture, blood (routine x 2)     Status: None (Preliminary result)   Collection Time: 03/26/24  3:24 AM   Specimen: BLOOD  Result Value Ref Range Status   Specimen Description   Final    BLOOD BLOOD RIGHT ARM Performed at [facility], [address] [place], [place] [address]    Special Requests   Final    BOTTLES DRAWN AEROBIC AND ANAEROBIC Blood Culture adequate volume Performed at [facility], [address], [place], [place] [address]    Culture   Final    NO GROWTH 1 DAY Performed at [facility], [address]  [place], [place] [address]    Report Status PENDING  Incomplete  Culture, blood (routine x 2)     Status: None (Preliminary result)   Collection Time: 03/26/24  4:18 AM   Specimen: BLOOD  Result Value Ref Range Status   Specimen Description   Final    BLOOD BLOOD LEFT ARM Performed at [facility], [address], [place], [place] [address]    Special Requests   Final    BOTTLES DRAWN AEROBIC AND ANAEROBIC Blood Culture adequate volume Performed at [facility], [address] [place], [place] [address]    Culture   Final    NO GROWTH 1 DAY Performed at [facility], [address], [place], [place] [address]    Report Status PENDING  Incomplete  Aerobic/Anaerobic Culture w Gram Stain (surgical/deep wound)     Status: None (Preliminary result)   Collection Time: 03/26/24 12:40 PM   Specimen: Cystic Hygroma Fluid  Result Value Ref Range Status   Specimen Description   Final    CYSTS Performed at [facility], [address], [place], [place] [address]     Special Requests   Final    NONE Performed at [facility], [address] [place], [place] [address]    Gram Stain NO WBC SEEN NO ORGANISMS SEEN   Final   Culture   Final    NO GROWTH < 24 HOURS Performed at [facility], [address], [place], [place] [address]    Report Status PENDING  Incomplete         Radiology Studies: CT Angio Chest/Abd/Pel for Dissection W and/or W/WO Result Date: 03/26/2024 CLINICAL DATA:  Left chest pain for a week. Constipation for 10 days. Left lower quadrant pain. Leukocytosis. Acute aortic syndrome suspected. EXAM: CT ANGIOGRAPHY CHEST, ABDOMEN AND PELVIS TECHNIQUE: Non-contrast CT of the chest was initially obtained. Multidetector CT imaging through the chest, abdomen and pelvis was performed using the standard protocol during bolus administration of intravenous contrast. Multiplanar reconstructed images and MIPs were obtained and reviewed to evaluate the vascular anatomy. RADIATION DOSE REDUCTION: This exam was performed according to the departmental dose-optimization program which includes automated exposure control, adjustment of the mA and/or kV according to patient size and/or use of iterative reconstruction technique. CONTRAST:  OMNIPAQUE IOHEXOL 350 MG/ML SOLN COMPARISON:  Abdomen and pelvis CT 05/19/2021 FINDINGS: CTA CHEST FINDINGS Cardiovascular: Pre contrast imaging shows no hyperdense crescent in the wall of the thoracic aorta to suggest the presence of an acute intramural hematoma. Moderate to advanced atherosclerotic disease noted in the thoracic aorta without aneurysm. No dissection of the thoracic aorta pulmonary arteries are well opacified without evidence of an acute pulmonary embolus. No substantial pericardial effusion. Mediastinum/Nodes: No mediastinal lymphadenopathy. There is no hilar lymphadenopathy. The esophagus has normal imaging features. There is no axillary lymphadenopathy. Lungs/Pleura: Centrilobular  and paraseptal emphysema evident. Dependent atelectasis noted right lower lobe. 3 mm ground-glass nodule identified left apex on 24/6. Calcified granuloma noted in the left upper lobe with calcified granulomata visible in the left lower lobe is well. No overtly suspicious pulmonary nodule or mass. No focal airspace consolidation. No pleural effusion. Musculoskeletal: No worrisome lytic or sclerotic osseous abnormality. Review of the MIP images confirms the above findings. CTA ABDOMEN AND PELVIS FINDINGS VASCULAR Aorta: Normal caliber aorta without aneurysm, dissection, vasculitis or significant stenosis. Moderate changes of atherosclerosis. Celiac: Patent without evidence of aneurysm, dissection, vasculitis or significant  stenosis. Variant anatomy with common trunk of the celiac axis and SMA. SMA: Patent without evidence of aneurysm, dissection, vasculitis or significant stenosis. Variant anatomy with common trunk of the celiac axis and SMA. Renals: Both renal arteries are patent without evidence of aneurysm, dissection, vasculitis, fibromuscular dysplasia or significant stenosis. IMA: Patent without evidence of aneurysm, dissection, vasculitis or significant stenosis. Inflow: Patent without evidence of aneurysm, dissection, vasculitis or significant stenosis. Veins: No obvious venous abnormality within the limitations of this arterial phase study. Review of the MIP images confirms the above findings. NON-VASCULAR Hepatobiliary: Cystic lesion in the posterior right hepatic lobe has increased in size in the interval measuring 11.9 x 11.5 cm today compared to 9.6 x 9.1 cm previously. Average attenuation of the contents of this cystic lesion have increased in the interval. The margins of the lesion become more ill-defined since prior with some irregularity no seen along the posterosuperior margin and an area of adjacent hypo attenuation in the liver parenchyma adjacent to the anterior margin of the lesion (image  108/10). Additional scattered hepatic cysts are evident with still more tiny low-density liver lesions that are too small to characterize but likely benign. Cholecystectomy. Common bile duct measures 14 mm on today's study, slightly increased from 12 mm previously with gradual tapering into the ampulla. Pancreas: No focal mass lesion. No dilatation of the main duct. No intraparenchymal cyst. No peripancreatic edema. Spleen: No splenomegaly. No suspicious focal mass lesion. Adrenals/Urinary Tract: No adrenal nodule or mass. Kidneys unremarkable. No evidence for hydroureter. The urinary bladder appears normal for the degree of distention. Stomach/Bowel: Stomach is unremarkable. No gastric wall thickening. No evidence of outlet obstruction. Duodenum is normally positioned as is the ligament of Treitz. No small bowel wall thickening. No small bowel dilatation. The terminal ileum is normal. The appendix is normal. No gross colonic mass. No colonic wall thickening. Diverticuli are seen scattered along the entire length of the colon without CT findings of diverticulitis. Lymphatic: There is no gastrohepatic or hepatoduodenal ligament lymphadenopathy. No retroperitoneal or mesenteric lymphadenopathy. No pelvic sidewall lymphadenopathy. Reproductive: Prostate gland is enlarged. Other: Trace free fluid is seen in the pelvis (255/10). Musculoskeletal: No worrisome lytic or sclerotic osseous abnormality. Review of the MIP images confirms the above findings. IMPRESSION: 1. No evidence for thoracoabdominal aortic aneurysm or dissection. 2. No evidence for acute pulmonary embolus. 3. Interval increase in size of a large cystic lesion in the posterior right hepatic lobe with increased attenuation of the contents. The margins of the lesion have become more ill-defined since prior with some irregularity now seen along the posterosuperior margin and an area of adjacent hypo attenuation in the liver parenchyma adjacent to the anterior  margin of the lesion. Features could reflect hemorrhage into this cyst. Given the history of leukocytosis, superinfection of a large hepatic cyst is also a consideration. 4. Trace free fluid in the pelvis. 5. Colonic diverticulosis without diverticulitis. 6. 3 mm ground-glass nodule left apex. No follow-up recommended. This recommendation follows the consensus statement: Guidelines for Management of Incidental Pulmonary Nodules Detected on CT Images: From the Fleischner Society 2017; Radiology 2017; 284:228-243. 7.  Emphysema (ICD10-J43.9) and Aortic Atherosclerosis (ICD10-170.0) Electronically Signed   By: Kennith Center M.D.   On: 03/26/2024 06:19   DG Chest 2 View Result Date: 03/26/2024 CLINICAL DATA:  Increasing chest pain EXAM: CHEST - 2 VIEW COMPARISON:  01/19/2017 FINDINGS: Cardiac shadow is stable. Mild emphysematous changes and fibrotic changes seen. Scattered calcified granulomas are noted within the left  lung stable from the prior study. No bony abnormality is noted. IMPRESSION: Chronic emphysematous changes with fibrosis. No acute abnormality noted. Electronically Signed   By: Violeta Grey M.D.   On: 03/26/2024 02:30        Scheduled Meds:  finasteride  5 mg Oral Daily   pantoprazole  40 mg Oral Daily   simvastatin  20 mg Oral QHS   Continuous Infusions:  sodium chloride 50 mL/hr at 03/26/24 1852   piperacillin-tazobactam (ZOSYN)  IV 3.375 g (03/27/24 1411)     LOS: 1 day    Time spent: 39 min  Barbee Lew, MD 03/27/2024, 3:05 PM

## 2024-03-27 NOTE — Progress Notes (Signed)
   03/27/24 1008  TOC Brief Assessment  Insurance and Status Reviewed  Patient has primary care physician Yes  Home environment has been reviewed single family home  Prior level of function: independent  Prior/Current Home Services No current home services  Social Drivers of Health Review SDOH reviewed no interventions necessary  Readmission risk has been reviewed Yes  Transition of care needs no transition of care needs at this time    Gustafson Primes, LCSW 03/27/2024 10:09 AM

## 2024-03-28 DIAGNOSIS — R6510 Systemic inflammatory response syndrome (SIRS) of non-infectious origin without acute organ dysfunction: Secondary | ICD-10-CM | POA: Diagnosis not present

## 2024-03-28 NOTE — Plan of Care (Signed)

## 2024-03-28 NOTE — Progress Notes (Signed)
 Mobility Specialist - Progress Note   03/28/24 1015  Mobility  Activity Ambulated independently in hallway;Ambulated independently to bathroom  Level of Assistance Independent  Assistive Device None  Distance Ambulated (ft) 500 ft  Activity Response Tolerated well  Mobility Referral Yes  Mobility visit 1 Mobility  Mobility Specialist Start Time (ACUTE ONLY) 1005  Mobility Specialist Stop Time (ACUTE ONLY) 1014  Mobility Specialist Time Calculation (min) (ACUTE ONLY) 9 min   Pt received in bed and agreeable to mobility. No complaints during session. Pt to bed after session with all needs met.    [facility]

## 2024-03-28 NOTE — Progress Notes (Signed)
 PROGRESS NOTE    Jamie Short  WNU:[id number] DOB: 09/18/1951 DOA: 03/26/2024 PCP: Patient, No Pcp Per   Brief Narrative: 73 y.o. male with medical history significant of osteoarthritis, BPH, urinary retention who presented to the emergency department complaints of chest and RUQ sharp abdominal pain that has been getting progressively worse for the past week.  He was seen at [facility] health urgent care and was recommended to return to the ER if chest pain persistent.  He has been constipated also for the past 10 days.  He has had chills, fatigue and malaise.  He was given tramadol  by his PCP without significant relief.  He feels constipated, but had a small bowel movement yesterday evening.  No  nausea, emesis, diarrhea, melena or hematochezia.  No flank pain, dysuria, frequency or hematuria. He denied fever, chills, rhinorrhea, sore throat, wheezing or hemoptysis.  No chest pain, palpitations, diaphoresis, PND, orthopnea or pitting edema of the lower extremities.     Imaging: 2 view chest radiograph with chron emphysematous change with fibrosis.  No abnormalities seen.  CT chest/abdomen/pelvis dissection study no evidence of aneurysm or dissection.  No evidence of PE.  Interval increase in size of a large cystic lesion in the posterior right hepatic lobe with increased attenuation of contents.  Trace free fluid in the pelvis.  Colonic diverticulosis without diverticulitis.  3 mm left apex groundglass nodule.  Emphysema.  Please see images in full detailed report for further details.   ED course: Initial vital signs were temperature 97.4 F, pulse 83, respiration 15, BP 117/66 mmHg and O2 sat 97% on room air.  The patient was started on Zosyn  in the emergency department, given fentanyl  50 mcg IVP x 3, ondansetron  4 mg IVP x 1 and normal saline 500 mL bolus.    Lab work: Urinalysis was normal.  CBC showed white count 22.1, hemoglobin 12.6 g/dL and platelets 034.  Normal PT and INR.  Lipase, and troponin level  were unremarkable.  Lactic acid only 0.4 mmol/L.  CMP showed a sodium 124 mmol/L, glucose 121, BUN 24 and creatinine 0.93 mg/dL.  LFTs were normal with the exception of an albumin level 2.8 g/dL.  Assessment & Plan:   Principal Problem:   SIRS (systemic inflammatory response syndrome) (HCC) Active Problems:   BPH (benign prostatic hyperplasia)   Hepatic cyst   Hyperlipidemia   Moderate protein malnutrition (HCC)   Normocytic anemia   Abdominal pain   Chest pain   Hepatic cyst -patient admitted with abdominal pain and chest pain was found to have a very enlarged liver cyst which has increased in size from 9 to almost 12 cm which was drained by interventional radiology.  Cultures are pending.  Cytology pending.  Continue Zosyn .     BPH (benign prostatic hyperplasia) Continue finasteride  5 mg p.o. daily.     Hyperlipidemia Continue simvastatin  20 mg p.o. at bedtime.   Estimated body mass index is 23.01 kg/m as calculated from the following:   Height as of this encounter: 5\' 4"  (1.626 m).   Weight as of this encounter: 60.8 kg.  DVT prophylaxis:scd Code Status: Full code Family Communication: Discussed with son over the phone Disposition Plan:  Status is: Inpatient Remains inpatient appropriate because: Acute illness   Consultants: Interventional radiology  Procedures: Hepatic cyst drainage by IR on 03/26/2024 Antimicrobials: Zosyn   Subjective: No new c/o  Dw son Objective: Vitals:   03/26/24 1817 03/26/24 2038 03/27/24 0551 03/28/24 0403  BP: (!) 93/54 94/62 117/66  124/72  Pulse: 65 74 71 61  Resp: 18 18 18 17   Temp: 98.3 F (36.8 C) (!) 97.5 F (36.4 C) 97.7 F (36.5 C) 98 F (36.7 C)  TempSrc:   Oral Oral  SpO2: 100% 99% 99% 100%  Weight:      Height:       No intake or output data in the 24 hours ending 03/28/24 1224  Filed Weights   03/26/24 1222 03/26/24 1700  Weight: 60.8 kg 60.8 kg    Examination:  General exam: Appears in no  distress Respiratory system: Clear to auscultation. Respiratory effort normal. Cardiovascular system: S1 & S2 heard, RRR. No JVD, murmurs, rubs, gallops or clicks. No pedal edema. Gastrointestinal system: Abdomen is nondistended, soft and nontender. No organomegaly or masses felt. Normal bowel sounds heard. Central nervous system: Alert and oriented. No focal neurological deficits. Extremities: No edema   Data Reviewed: I have personally reviewed following labs and imaging studies  CBC: Recent Labs  Lab 03/26/24 0208 03/27/24 0609  WBC 22.1* 13.3*  HGB 12.6* 12.7*  HCT 38.2* 39.2  MCV 96.5 97.5  PLT 466* 439*   Basic Metabolic Panel: Recent Labs  Lab 03/26/24 0208 03/27/24 0609  NA 134* 135  K 4.1 3.8  CL 100 104  CO2 26 23  GLUCOSE 121* 118*  BUN 24* 17  CREATININE 0.93 0.95  CALCIUM 8.3* 7.9*   GFR: Estimated Creatinine Clearance: 57.1 mL/min (by C-G formula based on SCr of 0.95 mg/dL). Liver Function Tests: Recent Labs  Lab 03/26/24 0208 03/27/24 0609  AST 15 17  ALT 30 27  ALKPHOS 110 97  BILITOT 0.8 0.7  PROT 6.6 6.1*  ALBUMIN 2.8* 2.5*   Recent Labs  Lab 03/26/24 0208  LIPASE 30   No results for input(s): "AMMONIA" in the last 168 hours. Coagulation Profile: Recent Labs  Lab 03/26/24 1053  INR 1.0   Cardiac Enzymes: No results for input(s): "CKTOTAL", "CKMB", "CKMBINDEX", "TROPONINI" in the last 168 hours. BNP (last 3 results) No results for input(s): "PROBNP" in the last 8760 hours. HbA1C: No results for input(s): "HGBA1C" in the last 72 hours. CBG: No results for input(s): "GLUCAP" in the last 168 hours. Lipid Profile: No results for input(s): "CHOL", "HDL", "LDLCALC", "TRIG", "CHOLHDL", "LDLDIRECT" in the last 72 hours. Thyroid Function Tests: No results for input(s): "TSH", "T4TOTAL", "FREET4", "T3FREE", "THYROIDAB" in the last 72 hours. Anemia Panel: No results for input(s): "VITAMINB12", "FOLATE", "FERRITIN", "TIBC", "IRON",  "RETICCTPCT" in the last 72 hours. Sepsis Labs: Recent Labs  Lab 03/26/24 0331  LATICACIDVEN 0.4*    Recent Results (from the past 240 hours)  Culture, blood (routine x 2)     Status: None (Preliminary result)   Collection Time: 03/26/24  3:24 AM   Specimen: BLOOD  Result Value Ref Range Status   Specimen Description   Final    BLOOD BLOOD RIGHT ARM Performed at [facility], [address] [place], [place] [address]    Special Requests   Final    BOTTLES DRAWN AEROBIC AND ANAEROBIC Blood Culture adequate volume Performed at [facility], [address], [place], [place] [address]    Culture   Final    NO GROWTH 2 DAYS Performed at [facility], [address], [place], [place] [address]    Report Status PENDING  Incomplete  Culture, blood (routine x 2)     Status: None (Preliminary result)   Collection Time: 03/26/24  4:18  AM   Specimen: BLOOD  Result Value Ref Range Status   Specimen Description   Final    BLOOD BLOOD LEFT ARM Performed at [facility], [address] [place], [place] [address]    Special Requests   Final    BOTTLES DRAWN AEROBIC AND ANAEROBIC Blood Culture adequate volume Performed at [facility], [address] [place], [place] [address]    Culture   Final    NO GROWTH 2 DAYS Performed at [facility], [address] [place], [place] [address]    Report Status PENDING  Incomplete  Aerobic/Anaerobic Culture w Gram Stain (surgical/deep wound)     Status: None (Preliminary result)   Collection Time: 03/26/24 12:40 PM   Specimen: Cystic Hygroma Fluid  Result Value Ref Range Status   Specimen Description   Final    CYSTS Performed at [facility], [address] [place], [place] [address]    Special Requests   Final    NONE Performed at [facility], [address] [place], [place] [address]    Gram Stain NO WBC  SEEN NO ORGANISMS SEEN   Final   Culture   Final    NO GROWTH 2 DAYS NO ANAEROBES ISOLATED; CULTURE IN PROGRESS FOR 5 DAYS Performed at [facility], [address], [place], [place] [address]    Report Status PENDING  Incomplete         Radiology Studies: US  FINE NEEDLE ASP 1ST LESION Result Date: 03/27/2024 INDICATION: [id number] Liver cyst [id number] EXAM: ULTRASOUND GUIDED DOMINANT RIGHT HEPATIC CYST ASPIRATION COMPARISON:  CT CAP, 03/26/2024.  CT AP, 05/19/2021 MEDICATIONS: None ANESTHESIA/SEDATION: Local anesthetic was administered. COMPLICATIONS: None immediate TECHNIQUE: Informed written consent was obtained from the patient after a discussion of the risks, benefits and alternatives to treatment. Questions regarding the procedure were encouraged and answered. A timeout was performed prior to the initiation of the procedure. The patient was positioned prone and the RIGHT upper quadrant was prepped and draped in the usual sterile fashion, and a sterile drape was applied covering the operative field. Maximum barrier sterile technique with sterile gowns and gloves were used for the procedure. A timeout was performed prior to the initiation of the procedure. Initial ultrasound scanning demonstrated a dominant RIGHT hepatic cyst. The RIGHT hepatic cyst was aspirated with a 5 Fr multi side-hole Yueh sheath needle after the overlying soft tissues was anesthetized with 1% lidocaine . A total of approximately 450 mL of clear, straw-colored fluid was aspirated. Samples were sent for cytology analysis. Postprocedural ultrasound imaging demonstrated decompression of the dominant posterior cysts with innumerable renal cysts remaining bilaterally. The patient tolerated the procedure well without immediate postprocedural complication. FINDINGS: *Dominant RIGHT hepatic cyst, measuring approximately 11.6 x 11.5 x 10.2 cm *Diagnostic and therapeutic aspiration with 450 mL of serosanguineous cyst fluid obtained.  IMPRESSION: Successful diagnostic and therapeutic dominant cyst aspiration under ultrasound guidance. Sample was submitted for microbiology and cytological analysis. Art Largo, MD [facility] Radiology Electronically Signed   By: Art Largo M.D.   On: 03/27/2024 15:24        Scheduled Meds:  finasteride   5 mg Oral Daily   pantoprazole   40 mg Oral Daily   simvastatin   20 mg Oral QHS   Continuous Infusions:  piperacillin -tazobactam (ZOSYN )  IV 3.375 g (03/28/24 0529)     LOS: 2 days    Time spent: 39 min  Nellie Banas  Catherene Close, MD 03/28/2024, 12:24 PM

## 2024-03-29 DIAGNOSIS — R6510 Systemic inflammatory response syndrome (SIRS) of non-infectious origin without acute organ dysfunction: Secondary | ICD-10-CM | POA: Diagnosis not present

## 2024-03-29 MED ORDER — CIPROFLOXACIN HCL 500 MG PO TABS: 500.0000 mg | ORAL_TABLET | Freq: Two times a day (BID) | ORAL | 0 refills | Status: DC

## 2024-03-29 MED ORDER — ACETAMINOPHEN 325 MG PO TABS: 650.0000 mg | ORAL_TABLET | Freq: Four times a day (QID) | ORAL | Status: AC | PRN

## 2024-03-29 MED ORDER — METRONIDAZOLE 500 MG PO TABS: 500.0000 mg | ORAL_TABLET | Freq: Two times a day (BID) | ORAL | 0 refills | Status: DC

## 2024-03-29 MED ORDER — ONDANSETRON HCL 4 MG PO TABS: 4.0000 mg | ORAL_TABLET | Freq: Four times a day (QID) | ORAL | 0 refills | Status: AC | PRN

## 2024-03-29 NOTE — Discharge Summary (Signed)
 Physician Discharge Summary  Jamie Short WJX:[id number] DOB: 1951-11-22 DOA: 03/26/2024  PCP: Patient, No Pcp Per  Admit date: 03/26/2024 Discharge date: 03/29/2024  Admitted From: Home  Disposition: Home Recommendations for Outpatient Follow-up:  Follow up with PCP in 1-2 weeks Please obtain BMP/CBC in one week Medication changes at the time of discharge Zestoretic  was stopped due to soft to normal blood pressure  Home Health: None Equipment/Devices: None Discharge Condition: Stable CODE STATUS: Full code Diet recommendation: Cardiac Brief/Interim Summary:  73 y.o. male with medical history significant of osteoarthritis, BPH, urinary retention who presented to the emergency department complaints of chest and RUQ sharp abdominal pain that has been getting progressively worse for the past week.  He was seen at [facility] health urgent care and was recommended to return to the ER if chest pain persistent.  He has been constipated also for the past 10 days.  He has had chills, fatigue and malaise.  He was given tramadol  by his PCP without significant relief.  He feels constipated, but had a small bowel movement yesterday evening.  No  nausea, emesis, diarrhea, melena or hematochezia.  No flank pain, dysuria, frequency or hematuria. He denied fever, chills, rhinorrhea, sore throat, wheezing or hemoptysis.  No chest pain, palpitations, diaphoresis, PND, orthopnea or pitting edema of the lower extremities.     Imaging: 2 view chest radiograph with chron emphysematous change with fibrosis.  No abnormalities seen.  CT chest/abdomen/pelvis dissection study no evidence of aneurysm or dissection.  No evidence of PE.  Interval increase in size of a large cystic lesion in the posterior right hepatic lobe with increased attenuation of contents.  Trace free fluid in the pelvis.  Colonic diverticulosis without diverticulitis.  3 mm left apex groundglass nodule.  Emphysema.  Please see images in full detailed report for  further details.   ED course: Initial vital signs were temperature 97.4 F, pulse 83, respiration 15, BP 117/66 mmHg and O2 sat 97% on room air.  The patient was started on Zosyn  in the emergency department, given fentanyl  50 mcg IVP x 3, ondansetron  4 mg IVP x 1 and normal saline 500 mL bolus.    Lab work: Urinalysis was normal.  CBC showed white count 22.1, hemoglobin 12.6 g/dL and platelets 213.  Normal PT and INR.  Lipase, and troponin level were unremarkable.  Lactic acid only 0.4 mmol/L.  CMP showed a sodium 124 mmol/L, glucose 121, BUN 24 and creatinine 0.93 mg/dL.  LFTs were normal with the exception of an albumin level 2.8 g/dL.  Discharge Diagnoses:  Principal Problem:   SIRS (systemic inflammatory response syndrome) (HCC) Active Problems:   BPH (benign prostatic hyperplasia)   Hepatic cyst   Hyperlipidemia   Moderate protein malnutrition (HCC)   Normocytic anemia   Abdominal pain   Chest pain    Hepatic cyst -patient admitted with abdominal pain and chest pain was found to have a very enlarged liver cyst which has increased in size from 9 to almost 12 cm which was drained by interventional radiology.  He drained 450 mL of serosanguineous fluid.  Cultures showed no growth in 3 days.  Cytology pending at the time of discharge.  He was treated with Zosyn  during the hospital stay and discharged on ciprofloxacin  500 twice daily and Flagyl  500 twice daily for a month.  Discussed with Dr. Fontaine Ice patient will need to follow-up with infectious disease and repeat a CT of the abdomen in the next 4 weeks   BPH (  benign prostatic hyperplasia) Continue finasteride  5 mg p.o. daily.     Hyperlipidemia Continue simvastatin  20 mg p.o. at bedtime.  Hypertension patient was on Zestoretic  prior to admission this has stopped at the time of discharged as his blood pressure was on the softer normal side.  I discussed this with his son.  This may need to be restarted as an outpatient at some point.  Did  not get Zestoretic  during his hospital stay.   Estimated body mass index is 23.01 kg/m as calculated from the following:   Height as of this encounter: 5\' 4"  (1.626 m).   Weight as of this encounter: 60.8 kg.  Discharge Instructions  Discharge Instructions     Diet - low sodium heart healthy   Complete by: As directed    Increase activity slowly   Complete by: As directed    No wound care   Complete by: As directed       Allergies as of 03/29/2024       Reactions   Other Other (See Comments)   Hot, spicy chili and foods = Not tolerated        Medication List     STOP taking these medications    lisinopril -hydrochlorothiazide  20-25 MG tablet Commonly known as: ZESTORETIC    omeprazole  40 MG capsule Commonly known as: PRILOSEC   traMADol  50 MG tablet Commonly known as: ULTRAM        TAKE these medications    acetaminophen  325 MG tablet Commonly known as: TYLENOL  Take 2 tablets (650 mg total) by mouth every 6 (six) hours as needed for mild pain (pain score 1-3) (or Fever >/= 101).   ciprofloxacin  500 MG tablet Commonly known as: Cipro  Take 1 tablet (500 mg total) by mouth 2 (two) times daily.   finasteride  5 MG tablet Commonly known as: PROSCAR  Take 5 mg by mouth daily.   metroNIDAZOLE  500 MG tablet Commonly known as: Flagyl  Take 1 tablet (500 mg total) by mouth 2 (two) times daily.   ondansetron  4 MG tablet Commonly known as: ZOFRAN  Take 1 tablet (4 mg total) by mouth every 6 (six) hours as needed for nausea.   simvastatin  20 MG tablet Commonly known as: ZOCOR  Take 20 mg by mouth at bedtime.        Follow-up Information     Ernie Heal, Jerelyn Money, MD. Schedule an appointment as soon as possible for a visit in 2 week(s).   Specialty: Infectious Diseases Contact information: [address] [place] [address] [phone]         Practice, [facility].   Specialty: Family Medicine Why: cbc cmp in 1  week Contact information: [address] [place] [address] [phone]                Allergies  Allergen Reactions   Other Other (See Comments)    Hot, spicy chili and foods = Not tolerated    Consultations: Dw id on phone   Procedures/Studies: US  FINE NEEDLE ASP 1ST LESION Result Date: 03/27/2024 INDICATION: [id number] Liver cyst [id number] EXAM: ULTRASOUND GUIDED DOMINANT RIGHT HEPATIC CYST ASPIRATION COMPARISON:  CT CAP, 03/26/2024.  CT AP, 05/19/2021 MEDICATIONS: None ANESTHESIA/SEDATION: Local anesthetic was administered. COMPLICATIONS: None immediate TECHNIQUE: Informed written consent was obtained from the patient after a discussion of the risks, benefits and alternatives to treatment. Questions regarding the procedure were encouraged and answered. A timeout was performed prior to the initiation of the procedure. The patient was positioned prone  and the RIGHT upper quadrant was prepped and draped in the usual sterile fashion, and a sterile drape was applied covering the operative field. Maximum barrier sterile technique with sterile gowns and gloves were used for the procedure. A timeout was performed prior to the initiation of the procedure. Initial ultrasound scanning demonstrated a dominant RIGHT hepatic cyst. The RIGHT hepatic cyst was aspirated with a 5 Fr multi side-hole Yueh sheath needle after the overlying soft tissues was anesthetized with 1% lidocaine . A total of approximately 450 mL of clear, straw-colored fluid was aspirated. Samples were sent for cytology analysis. Postprocedural ultrasound imaging demonstrated decompression of the dominant posterior cysts with innumerable renal cysts remaining bilaterally. The patient tolerated the procedure well without immediate postprocedural complication. FINDINGS: *Dominant RIGHT hepatic cyst, measuring approximately 11.6 x 11.5 x 10.2 cm *Diagnostic and therapeutic aspiration with 450 mL of serosanguineous cyst fluid obtained.  IMPRESSION: Successful diagnostic and therapeutic dominant cyst aspiration under ultrasound guidance. Sample was submitted for microbiology and cytological analysis. Art Largo, MD [facility] Electronically Signed   By: Art Largo M.D.   On: 03/27/2024 15:24   CT Angio Chest/Abd/Pel for Dissection W and/or W/WO Result Date: 03/26/2024 CLINICAL DATA:  Left chest pain for a week. Constipation for 10 days. Left lower quadrant pain. Leukocytosis. Acute aortic syndrome suspected. EXAM: CT ANGIOGRAPHY CHEST, ABDOMEN AND PELVIS TECHNIQUE: Non-contrast CT of the chest was initially obtained. Multidetector CT imaging through the chest, abdomen and pelvis was performed using the standard protocol during bolus administration of intravenous contrast. Multiplanar reconstructed images and MIPs were obtained and reviewed to evaluate the vascular anatomy. RADIATION DOSE REDUCTION: This exam was performed according to the departmental dose-optimization program which includes automated exposure control, adjustment of the mA and/or kV according to patient size and/or use of iterative reconstruction technique. CONTRAST:  OMNIPAQUE  IOHEXOL  350 MG/ML SOLN COMPARISON:  Abdomen and pelvis CT 05/19/2021 FINDINGS: CTA CHEST FINDINGS Cardiovascular: Pre contrast imaging shows no hyperdense crescent in the wall of the thoracic aorta to suggest the presence of an acute intramural hematoma. Moderate to advanced atherosclerotic disease noted in the thoracic aorta without aneurysm. No dissection of the thoracic aorta pulmonary arteries are well opacified without evidence of an acute pulmonary embolus. No substantial pericardial effusion. Mediastinum/Nodes: No mediastinal lymphadenopathy. There is no hilar lymphadenopathy. The esophagus has normal imaging features. There is no axillary lymphadenopathy. Lungs/Pleura: Centrilobular and paraseptal emphysema evident. Dependent  atelectasis noted right lower lobe. 3 mm ground-glass nodule identified left apex on 24/6. Calcified granuloma noted in the left upper lobe with calcified granulomata visible in the left lower lobe is well. No overtly suspicious pulmonary nodule or mass. No focal airspace consolidation. No pleural effusion. Musculoskeletal: No worrisome lytic or sclerotic osseous abnormality. Review of the MIP images confirms the above findings. CTA ABDOMEN AND PELVIS FINDINGS VASCULAR Aorta: Normal caliber aorta without aneurysm, dissection, vasculitis or significant stenosis. Moderate changes of atherosclerosis. Celiac: Patent without evidence of aneurysm, dissection, vasculitis or significant stenosis. Variant anatomy with common trunk of the celiac axis and SMA. SMA: Patent without evidence of aneurysm, dissection, vasculitis or significant stenosis. Variant anatomy with common trunk of the celiac axis and SMA. Renals: Both renal arteries are patent without evidence of aneurysm, dissection, vasculitis, fibromuscular dysplasia or significant stenosis. IMA: Patent without evidence of aneurysm, dissection, vasculitis or significant stenosis. Inflow: Patent without evidence of aneurysm, dissection, vasculitis or significant stenosis. Veins: No obvious venous abnormality within the limitations of this arterial  phase study. Review of the MIP images confirms the above findings. NON-VASCULAR Hepatobiliary: Cystic lesion in the posterior right hepatic lobe has increased in size in the interval measuring 11.9 x 11.5 cm today compared to 9.6 x 9.1 cm previously. Average attenuation of the contents of this cystic lesion have increased in the interval. The margins of the lesion become more ill-defined since prior with some irregularity no seen along the posterosuperior margin and an area of adjacent hypo attenuation in the liver parenchyma adjacent to the anterior margin of the lesion (image 108/10). Additional scattered hepatic cysts are  evident with still more tiny low-density liver lesions that are too small to characterize but likely benign. Cholecystectomy. Common bile duct measures 14 mm on today's study, slightly increased from 12 mm previously with gradual tapering into the ampulla. Pancreas: No focal mass lesion. No dilatation of the main duct. No intraparenchymal cyst. No peripancreatic edema. Spleen: No splenomegaly. No suspicious focal mass lesion. Adrenals/Urinary Tract: No adrenal nodule or mass. Kidneys unremarkable. No evidence for hydroureter. The urinary bladder appears normal for the degree of distention. Stomach/Bowel: Stomach is unremarkable. No gastric wall thickening. No evidence of outlet obstruction. Duodenum is normally positioned as is the ligament of Treitz. No small bowel wall thickening. No small bowel dilatation. The terminal ileum is normal. The appendix is normal. No gross colonic mass. No colonic wall thickening. Diverticuli are seen scattered along the entire length of the colon without CT findings of diverticulitis. Lymphatic: There is no gastrohepatic or hepatoduodenal ligament lymphadenopathy. No retroperitoneal or mesenteric lymphadenopathy. No pelvic sidewall lymphadenopathy. Reproductive: Prostate gland is enlarged. Other: Trace free fluid is seen in the pelvis (255/10). Musculoskeletal: No worrisome lytic or sclerotic osseous abnormality. Review of the MIP images confirms the above findings. IMPRESSION: 1. No evidence for thoracoabdominal aortic aneurysm or dissection. 2. No evidence for acute pulmonary embolus. 3. Interval increase in size of a large cystic lesion in the posterior right hepatic lobe with increased attenuation of the contents. The margins of the lesion have become more ill-defined since prior with some irregularity now seen along the posterosuperior margin and an area of adjacent hypo attenuation in the liver parenchyma adjacent to the anterior margin of the lesion. Features could reflect  hemorrhage into this cyst. Given the history of leukocytosis, superinfection of a large hepatic cyst is also a consideration. 4. Trace free fluid in the pelvis. 5. Colonic diverticulosis without diverticulitis. 6. 3 mm ground-glass nodule left apex. No follow-up recommended. This recommendation follows the consensus statement: Guidelines for Management of Incidental Pulmonary Nodules Detected on CT Images: From the Fleischner Society 2017; Radiology 2017; 284:228-243. 7.  Emphysema (ICD10-J43.9) and Aortic Atherosclerosis (ICD10-170.0) Electronically Signed   By: Donnal Fusi M.D.   On: 03/26/2024 06:19   DG Chest 2 View Result Date: 03/26/2024 CLINICAL DATA:  Increasing chest pain EXAM: CHEST - 2 VIEW COMPARISON:  01/19/2017 FINDINGS: Cardiac shadow is stable. Mild emphysematous changes and fibrotic changes seen. Scattered calcified granulomas are noted within the left lung stable from the prior study. No bony abnormality is noted. IMPRESSION: Chronic emphysematous changes with fibrosis. No acute abnormality noted. Electronically Signed   By: Violeta Grey M.D.   On: 03/26/2024 02:30   (Echo, Carotid, EGD, Colonoscopy, ERCP)    Subjective:  Resting in bed no complaints no events overnight Discharge Exam: Vitals:   03/28/24 1947 03/29/24 0538  BP: 117/64 122/71  Pulse: 73 76  Resp: 18 18  Temp: 97.8 F (36.6 C) 97.9 F (  36.6 C)  SpO2: 99% 98%   Vitals:   03/28/24 1502 03/28/24 1621 03/28/24 1947 03/29/24 0538  BP: 129/75  117/64 122/71  Pulse: 78  73 76  Resp: 19  18 18   Temp: 98 F (36.7 C) 99.1 F (37.3 C) 97.8 F (36.6 C) 97.9 F (36.6 C)  TempSrc: Oral  Oral Oral  SpO2: 100%  99% 98%  Weight:      Height:        General: Pt is alert, awake, not in acute distress Cardiovascular: RRR, S1/S2 +, no rubs, no gallops Respiratory: CTA bilaterally, no wheezing, no rhonchi Abdominal: Soft, NT, ND, bowel sounds + Extremities: no edema, no cyanosis    The results of significant  diagnostics from this hospitalization (including imaging, microbiology, ancillary and laboratory) are listed below for reference.     Microbiology: Recent Results (from the past 240 hours)  Culture, blood (routine x 2)     Status: None (Preliminary result)   Collection Time: 03/26/24  3:24 AM   Specimen: BLOOD  Result Value Ref Range Status   Specimen Description   Final    BLOOD BLOOD RIGHT ARM Performed at [facility], [address] [place], [place] [address]    Special Requests   Final    BOTTLES DRAWN AEROBIC AND ANAEROBIC Blood Culture adequate volume Performed at [facility], [address], [place], [place] [address]    Culture   Final    NO GROWTH 3 DAYS Performed at [facility], [address], [place], [place] [address]    Report Status PENDING  Incomplete  Culture, blood (routine x 2)     Status: None (Preliminary result)   Collection Time: 03/26/24  4:18 AM   Specimen: BLOOD  Result Value Ref Range Status   Specimen Description   Final    BLOOD BLOOD LEFT ARM Performed at [facility], [address] [place], [place] [address]    Special Requests   Final    BOTTLES DRAWN AEROBIC AND ANAEROBIC Blood Culture adequate volume Performed at [facility], [address], [place], [place] [address]    Culture   Final    NO GROWTH 3 DAYS Performed at [facility], [address] [place], [place] [address]    Report Status PENDING  Incomplete  Aerobic/Anaerobic Culture w Gram Stain (surgical/deep wound)     Status: None (Preliminary result)   Collection Time: 03/26/24 12:40 PM   Specimen: Cystic Hygroma Fluid  Result Value Ref Range Status   Specimen Description   Final    CYSTS Performed at [facility], [address] [place], [place] [address]    Special Requests   Final    NONE Performed at [facility], [address]  [place], [place] [address]    Gram Stain NO WBC SEEN NO ORGANISMS SEEN   Final   Culture   Final    NO GROWTH 3 DAYS NO ANAEROBES ISOLATED; CULTURE IN PROGRESS FOR 5 DAYS Performed at [facility], [address], [place], [place] [address]    Report Status PENDING  Incomplete     Labs: BNP (last 3 results) No results for input(s): "BNP" in the last 8760 hours. Basic Metabolic Panel: Recent Labs  Lab 03/26/24 0208 03/27/24 0609  NA 134* 135  K 4.1 3.8  CL 100 104  CO2 26 23  GLUCOSE 121* 118*  BUN 24* 17  CREATININE 0.93 0.95  CALCIUM 8.3* 7.9*   Liver Function Tests: Recent Labs  Lab 03/26/24 0208 03/27/24 0609  AST 15 17  ALT 30 27  ALKPHOS 110 97  BILITOT 0.8 0.7  PROT 6.6 6.1*  ALBUMIN 2.8* 2.5*   Recent Labs  Lab 03/26/24 0208  LIPASE 30   No results for input(s): "AMMONIA" in the last 168 hours. CBC: Recent Labs  Lab 03/26/24 0208 03/27/24 0609  WBC 22.1* 13.3*  HGB 12.6* 12.7*  HCT 38.2* 39.2  MCV 96.5 97.5  PLT 466* 439*   Cardiac Enzymes: No results for input(s): "CKTOTAL", "CKMB", "CKMBINDEX", "TROPONINI" in the last 168 hours. BNP: Invalid input(s): "POCBNP" CBG: No results for input(s): "GLUCAP" in the last 168 hours. D-Dimer No results for input(s): "DDIMER" in the last 72 hours. Hgb A1c No results for input(s): "HGBA1C" in the last 72 hours. Lipid Profile No results for input(s): "CHOL", "HDL", "LDLCALC", "TRIG", "CHOLHDL", "LDLDIRECT" in the last 72 hours. Thyroid function studies No results for input(s): "TSH", "T4TOTAL", "T3FREE", "THYROIDAB" in the last 72 hours.  Invalid input(s): "FREET3" Anemia work up No results for input(s): "VITAMINB12", "FOLATE", "FERRITIN", "TIBC", "IRON", "RETICCTPCT" in the last 72 hours. Urinalysis    Component Value Date/Time   COLORURINE YELLOW 03/26/2024 0208   APPEARANCEUR CLEAR 03/26/2024 0208   LABSPEC 1.021 03/26/2024 0208   PHURINE 7.0 03/26/2024 0208   GLUCOSEU NEGATIVE 03/26/2024  0208   HGBUR NEGATIVE 03/26/2024 0208   BILIRUBINUR NEGATIVE 03/26/2024 0208   KETONESUR NEGATIVE 03/26/2024 0208   PROTEINUR NEGATIVE 03/26/2024 0208   UROBILINOGEN 0.2 09/11/2015 0900   NITRITE NEGATIVE 03/26/2024 0208   LEUKOCYTESUR NEGATIVE 03/26/2024 0208   Sepsis Labs Recent Labs  Lab 03/26/24 0208 03/27/24 0609  WBC 22.1* 13.3*   Microbiology Recent Results (from the past 240 hours)  Culture, blood (routine x 2)     Status: None (Preliminary result)   Collection Time: 03/26/24  3:24 AM   Specimen: BLOOD  Result Value Ref Range Status   Specimen Description   Final    BLOOD BLOOD RIGHT ARM Performed at [facility], [address] [place], [place] [address]    Special Requests   Final    BOTTLES DRAWN AEROBIC AND ANAEROBIC Blood Culture adequate volume Performed at [facility], [address] [place], [place] [address]    Culture   Final    NO GROWTH 3 DAYS Performed at [facility], [address], [place], [place] [address]    Report Status PENDING  Incomplete  Culture, blood (routine x 2)     Status: None (Preliminary result)   Collection Time: 03/26/24  4:18 AM   Specimen: BLOOD  Result Value Ref Range Status   Specimen Description   Final    BLOOD BLOOD LEFT ARM Performed at [facility], [address] [place], [place] [address]    Special Requests   Final    BOTTLES DRAWN AEROBIC AND ANAEROBIC Blood Culture adequate volume Performed at [facility], [address] [place], [place] [address]    Culture   Final    NO GROWTH 3 DAYS Performed at [facility], [address], [place], [place] [address]    Report Status PENDING  Incomplete  Aerobic/Anaerobic Culture w Gram Stain (surgical/deep wound)     Status: None (Preliminary result)   Collection Time: 03/26/24 12:40 PM   Specimen: Cystic Hygroma Fluid  Result Value Ref Range Status   Specimen Description    Final    CYSTS Performed at [facility], [address], [place], [place] [address]    Special Requests   Final    NONE Performed at [facility], [address], [place], [place] [address]    Gram Stain NO WBC SEEN NO ORGANISMS SEEN   Final   Culture   Final    NO GROWTH 3 DAYS NO ANAEROBES ISOLATED; CULTURE IN PROGRESS FOR 5 DAYS Performed at [facility], [address] [place], [place] [address]    Report Status PENDING  Incomplete     Time coordinating discharge: 39 minutes  SIGNED:  Barbee Lew, MD  [facility] 03/29/2024, 5:03 PM

## 2024-03-29 NOTE — Progress Notes (Signed)
 Mobility Specialist - Progress Note   03/29/24 0941  Mobility  Activity Ambulated independently in hallway  Level of Assistance Independent  Assistive Device None  Distance Ambulated (ft) 500 ft  Activity Response Tolerated well  Mobility Referral Yes  Mobility visit 1 Mobility  Mobility Specialist Start Time (ACUTE ONLY) [id number]  Mobility Specialist Stop Time (ACUTE ONLY) 0940  Mobility Specialist Time Calculation (min) (ACUTE ONLY) 4 min   Pt received in bed and agreeable to mobility. No complaints during session. Pt to bed after session with all needs met.    [facility]

## 2024-03-29 NOTE — Plan of Care (Signed)

## 2024-03-31 LAB — CULTURE, BLOOD (ROUTINE X 2)
Culture: NO GROWTH
Culture: NO GROWTH
Special Requests: ADEQUATE
Special Requests: ADEQUATE

## 2024-03-31 LAB — CYTOLOGY - NON PAP

## 2024-03-31 LAB — AEROBIC/ANAEROBIC CULTURE W GRAM STAIN (SURGICAL/DEEP WOUND)
Culture: NO GROWTH
Gram Stain: NONE SEEN

## 2024-04-14 ENCOUNTER — Other Ambulatory Visit: Payer: Self-pay

## 2024-04-14 ENCOUNTER — Encounter: Payer: Self-pay | Admitting: Infectious Diseases

## 2024-04-14 ENCOUNTER — Ambulatory Visit (INDEPENDENT_AMBULATORY_CARE_PROVIDER_SITE_OTHER): Admitting: Infectious Diseases

## 2024-04-14 VITALS — BP 119/71 | HR 68 | Temp 98.1°F | Wt 131.0 lb

## 2024-04-14 DIAGNOSIS — Z79899 Other long term (current) drug therapy: Secondary | ICD-10-CM | POA: Diagnosis not present

## 2024-04-14 DIAGNOSIS — K7689 Other specified diseases of liver: Secondary | ICD-10-CM

## 2024-04-14 DIAGNOSIS — K750 Abscess of liver: Secondary | ICD-10-CM | POA: Diagnosis not present

## 2024-04-14 NOTE — Progress Notes (Unsigned)
 Patient Active Problem List   Diagnosis Date Noted   SIRS (systemic inflammatory response syndrome) (HCC) 03/26/2024   Hepatic cyst 03/26/2024   Hyperlipidemia 03/26/2024   Moderate protein malnutrition (HCC) 03/26/2024   Normocytic anemia 03/26/2024   Abdominal pain 03/26/2024   Chest pain 03/26/2024   GI bleed 02/25/2020   Anemia associated with acute blood loss 02/25/2020   Orthostatic hypotension 02/25/2020   Hemorrhoids 02/25/2020   Rectal bleeding    BPH (benign prostatic hyperplasia) 10/18/2015   Colon cancer screening 10/08/2014   Back pain of thoracolumbar region 09/10/2013   Benign prostatic hyperplasia 09/10/2013    Patient's Medications  New Prescriptions   No medications on file  Previous Medications   ACETAMINOPHEN  (TYLENOL ) 325 MG TABLET    Take 2 tablets (650 mg total) by mouth every 6 (six) hours as needed for mild pain (pain score 1-3) (or Fever >/= 101).   CIPROFLOXACIN  (CIPRO ) 500 MG TABLET    Take 1 tablet (500 mg total) by mouth 2 (two) times daily.   FINASTERIDE  (PROSCAR ) 5 MG TABLET    Take 5 mg by mouth daily.   METRONIDAZOLE  (FLAGYL ) 500 MG TABLET    Take 1 tablet (500 mg total) by mouth 2 (two) times daily.   ONDANSETRON  (ZOFRAN ) 4 MG TABLET    Take 1 tablet (4 mg total) by mouth every 6 (six) hours as needed for nausea.   SIMVASTATIN  (ZOCOR ) 20 MG TABLET    Take 20 mg by mouth at bedtime.  Modified Medications   No medications on file  Discontinued Medications   No medications on file    Subjective: 73 year old male with prior h/o arthritis, BPH w urinary retention s/p TURP, HTN, HLD,  who is here for HFU after recent hospital admission on 4/16-4/19 for possible infected hepatic cyst.   He initially presented to ED with chest and RUQ abdominal pain for a week with chills, fatigue and malaise. WBC elevated to 22.1. CT CAP dissection study with interval increase in size of a large cystic lesion in the posterior rt hepatic lobe with increased  attenuation of contents. It was drained by IR with 450 ml of serosanguinous fluid. Cx NG ( was on zosyn ). Cytology with no malignant cells. Discharged on 4/19 on ciprofloxacin  and metronidazole  for a prolonged course and planned for repeat imaging.   5/6 Accompanied by son who helps with interpretation as well as used video interpreter. Taking PO ciprofloxacin  and metronidazole  without missimh doses or any concerns. Denies abdominal pain, nausea, vomiting, or diarrhee. Appetite is OK.    Ex smoker, denies alcohol and recreational drug use. Moved from [place] to [place]  in 2001 and lives with son. Thinks he had prior imaging done in [place] that showed liver cyst but was told not to worry about.  Review of Systems: all systems reviewed with pertinent positives and negatives as listed above including GI.   Past Medical History:  Diagnosis Date   Arthritis    lower legs   BPH (benign prostatic hyperplasia)    Foley catheter in place    Urinary retention    Wears dentures    Wears glasses    Past Surgical History:  Procedure Laterality Date   COLONOSCOPY WITH PROPOFOL  N/A 02/26/2020   Procedure: COLONOSCOPY WITH PROPOFOL ;  Surgeon: Nannette Babe, MD;  Location: [facility];  Service: Gastroenterology;  Laterality: N/A;   LAPAROSCOPIC CHOLECYSTECTOMY  2006 approx   TRANSURETHRAL RESECTION OF PROSTATE N/A 10/18/2015  Procedure: TRANSURETHRAL RESECTION OF THE PROSTATE WITH GYRUS INSTRUMENTS WITH RESECTION OF BLADDER WALL TUMOR;  Surgeon: Marco Severs, MD;  Location: [facility];  Service: Urology;  Laterality: N/A;    Social History   Tobacco Use   Smoking status: Former    Current packs/day: 0.50    Average packs/day: 0.5 packs/day for 40.0 years (20.0 ttl pk-yrs)    Types: Cigarettes   Smokeless tobacco: Never  Vaping Use   Vaping status: Never Used  Substance Use Topics   Alcohol use: No   Drug use: No    No family history on file.  Allergies  Allergen  Reactions   Other Other (See Comments)    Hot, spicy chili and foods = Not tolerated    Health Maintenance  Topic Date Due   Medicare Annual Wellness (AWV)  Never done   Hepatitis C Screening  Never done   Zoster Vaccines- Shingrix (1 of 2) Never done   Pneumonia Vaccine 42+ Years old (2 of 2 - PCV) 08/01/2016   COVID-19 Vaccine (1 - 2024-25 season) Never done   DTaP/Tdap/Td (2 - Td or Tdap) 02/25/2024   INFLUENZA VACCINE  07/11/2024   Lung Cancer Screening  03/26/2025   Colonoscopy  02/25/2030   HPV VACCINES  Aged Out   Meningococcal B Vaccine  Aged Out    Objective:  Vitals:   04/14/24 1015  BP: 119/71  Pulse: 68  Temp: 98.1 F (36.7 C)  TempSrc: Temporal  SpO2: 97%  Weight: 131 lb (59.4 kg)   Body mass index is 22.49 kg/m.  Physical Exam Constitutional:      Appearance: Normal appearance.  HENT:     Head: Normocephalic and atraumatic.      Mouth: Mucous membranes are moist.  Eyes:    Conjunctiva/sclera: Conjunctivae normal.     Pupils: Pupils are equal, round, and b/l symmetrical  Cardiovascular:     Rate and Rhythm: Normal rate and regular rhythm.     Heart sounds: s1s2  Pulmonary:     Effort: Pulmonary effort is normal.     Breath sounds: Normal breath sounds.   Abdominal:     General: Non distended     Palpations: soft.   Musculoskeletal:        General: Normal range of motion.   Skin:    General: Skin is warm and dry.     Comments:  Neurological:     General: grossly non focal     Mental Status: awake, alert and oriented to person, place, and time.   Psychiatric:        Mood and Affect: Mood normal.   Lab Results Lab Results  Component Value Date   WBC 13.3 (H) 03/27/2024   HGB 12.7 (L) 03/27/2024   HCT 39.2 03/27/2024   MCV 97.5 03/27/2024   PLT 439 (H) 03/27/2024    Lab Results  Component Value Date   CREATININE 0.95 03/27/2024   BUN 17 03/27/2024   NA 135 03/27/2024   K 3.8 03/27/2024   CL 104 03/27/2024   CO2 23  03/27/2024    Lab Results  Component Value Date   ALT 27 03/27/2024   AST 17 03/27/2024   ALKPHOS 97 03/27/2024   BILITOT 0.7 03/27/2024    Lab Results  Component Value Date   CHOL 157 10/10/2013   HDL 45 10/10/2013   LDLCALC 96 10/10/2013   TRIG 81 10/10/2013   CHOLHDL 3.5 10/10/2013   No results found  for: "LABRPR", "RPRTITER" No results found for: "HIV1RNAQUANT", "HIV1RNAVL", "CD4TABS"   Microbiology Results for orders placed or performed during the hospital encounter of 03/26/24  Culture, blood (routine x 2)     Status: None   Collection Time: 03/26/24  3:24 AM   Specimen: BLOOD  Result Value Ref Range Status   Specimen Description   Final    BLOOD BLOOD RIGHT ARM Performed at [facility], [address] [place], [place] [address]    Special Requests   Final    BOTTLES DRAWN AEROBIC AND ANAEROBIC Blood Culture adequate volume Performed at [facility], [address] [place], [place] [address]    Culture   Final    NO GROWTH 5 DAYS Performed at [facility], [address] [place], [place] [address]    Report Status 03/31/2024 FINAL  Final  Culture, blood (routine x 2)     Status: None   Collection Time: 03/26/24  4:18 AM   Specimen: BLOOD  Result Value Ref Range Status   Specimen Description   Final    BLOOD BLOOD LEFT ARM Performed at [facility], [address] [place], [place] [address]    Special Requests   Final    BOTTLES DRAWN AEROBIC AND ANAEROBIC Blood Culture adequate volume Performed at [facility], [address], [place], [place] [address]    Culture   Final    NO GROWTH 5 DAYS Performed at [facility], [address], [place], [place] [address]    Report Status 03/31/2024 FINAL  Final  Aerobic/Anaerobic Culture w Gram Stain (surgical/deep wound)     Status: None   Collection Time: 03/26/24 12:40 PM   Specimen: Cystic Hygroma Fluid  Result Value  Ref Range Status   Specimen Description   Final    CYSTS Performed at [facility], [address] [place], [place] [address]    Special Requests   Final    NONE Performed at [facility], [address], [place], [place] [address]    Gram Stain NO WBC SEEN NO ORGANISMS SEEN   Final   Culture   Final    No growth aerobically or anaerobically. Performed at [facility], [address], [place], [place] [address]    Report Status 03/31/2024 FINAL  Final   Imaging US  FINE NEEDLE ASP 1ST LESION Result Date: 03/27/2024 INDICATION: [id number] Liver cyst [id number] EXAM: ULTRASOUND GUIDED DOMINANT RIGHT HEPATIC CYST ASPIRATION COMPARISON:  CT CAP, 03/26/2024.  CT AP, 05/19/2021 MEDICATIONS: None ANESTHESIA/SEDATION: Local anesthetic was administered. COMPLICATIONS: None immediate TECHNIQUE: Informed written consent was obtained from the patient after a discussion of the risks, benefits and alternatives to treatment. Questions regarding the procedure were encouraged and answered. A timeout was performed prior to the initiation of the procedure. The patient was positioned prone and the RIGHT upper quadrant was prepped and draped in the usual sterile fashion, and a sterile drape was applied covering the operative field. Maximum barrier sterile technique with sterile gowns and gloves were used for the procedure. A timeout was performed prior to the initiation of the procedure. Initial ultrasound scanning demonstrated a dominant RIGHT hepatic cyst. The RIGHT hepatic cyst was aspirated with a 5 Fr multi side-hole Yueh sheath needle after the overlying soft tissues was anesthetized with 1% lidocaine . A total of approximately 450 mL of clear, straw-colored fluid was aspirated. Samples were sent for cytology analysis. Postprocedural  ultrasound imaging demonstrated decompression of the dominant posterior cysts with innumerable renal cysts remaining bilaterally. The patient  tolerated the procedure well without immediate postprocedural complication. FINDINGS: *Dominant RIGHT hepatic cyst, measuring approximately 11.6 x 11.5 x 10.2 cm *Diagnostic and therapeutic aspiration with 450 mL of serosanguineous cyst fluid obtained. IMPRESSION: Successful diagnostic and therapeutic dominant cyst aspiration under ultrasound guidance. Sample was submitted for microbiology and cytological analysis. Art Largo, MD [facility] Electronically Signed   By: Art Largo M.D.   On: 03/27/2024 15:24   CT Angio Chest/Abd/Pel for Dissection W and/or W/WO Result Date: 03/26/2024 CLINICAL DATA:  Left chest pain for a week. Constipation for 10 days. Left lower quadrant pain. Leukocytosis. Acute aortic syndrome suspected. EXAM: CT ANGIOGRAPHY CHEST, ABDOMEN AND PELVIS TECHNIQUE: Non-contrast CT of the chest was initially obtained. Multidetector CT imaging through the chest, abdomen and pelvis was performed using the standard protocol during bolus administration of intravenous contrast. Multiplanar reconstructed images and MIPs were obtained and reviewed to evaluate the vascular anatomy. RADIATION DOSE REDUCTION: This exam was performed according to the departmental dose-optimization program which includes automated exposure control, adjustment of the mA and/or kV according to patient size and/or use of iterative reconstruction technique. CONTRAST:  OMNIPAQUE  IOHEXOL  350 MG/ML SOLN COMPARISON:  Abdomen and pelvis CT 05/19/2021 FINDINGS: CTA CHEST FINDINGS Cardiovascular: Pre contrast imaging shows no hyperdense crescent in the wall of the thoracic aorta to suggest the presence of an acute intramural hematoma. Moderate to advanced atherosclerotic disease noted in the thoracic aorta without aneurysm. No dissection of the thoracic aorta pulmonary arteries are well opacified without evidence of an acute pulmonary embolus. No substantial pericardial  effusion. Mediastinum/Nodes: No mediastinal lymphadenopathy. There is no hilar lymphadenopathy. The esophagus has normal imaging features. There is no axillary lymphadenopathy. Lungs/Pleura: Centrilobular and paraseptal emphysema evident. Dependent atelectasis noted right lower lobe. 3 mm ground-glass nodule identified left apex on 24/6. Calcified granuloma noted in the left upper lobe with calcified granulomata visible in the left lower lobe is well. No overtly suspicious pulmonary nodule or mass. No focal airspace consolidation. No pleural effusion. Musculoskeletal: No worrisome lytic or sclerotic osseous abnormality. Review of the MIP images confirms the above findings. CTA ABDOMEN AND PELVIS FINDINGS VASCULAR Aorta: Normal caliber aorta without aneurysm, dissection, vasculitis or significant stenosis. Moderate changes of atherosclerosis. Celiac: Patent without evidence of aneurysm, dissection, vasculitis or significant stenosis. Variant anatomy with common trunk of the celiac axis and SMA. SMA: Patent without evidence of aneurysm, dissection, vasculitis or significant stenosis. Variant anatomy with common trunk of the celiac axis and SMA. Renals: Both renal arteries are patent without evidence of aneurysm, dissection, vasculitis, fibromuscular dysplasia or significant stenosis. IMA: Patent without evidence of aneurysm, dissection, vasculitis or significant stenosis. Inflow: Patent without evidence of aneurysm, dissection, vasculitis or significant stenosis. Veins: No obvious venous abnormality within the limitations of this arterial phase study. Review of the MIP images confirms the above findings. NON-VASCULAR Hepatobiliary: Cystic lesion in the posterior right hepatic lobe has increased in size in the interval measuring 11.9 x 11.5 cm today compared to 9.6 x 9.1 cm previously. Average attenuation of the contents of this cystic lesion have increased in the interval. The margins of the lesion become more  ill-defined since prior with some irregularity no seen along the posterosuperior margin and an area of adjacent hypo attenuation in the liver parenchyma adjacent to the anterior margin of the lesion (image 108/10). Additional scattered hepatic cysts are evident  with still more tiny low-density liver lesions that are too small to characterize but likely benign. Cholecystectomy. Common bile duct measures 14 mm on today's study, slightly increased from 12 mm previously with gradual tapering into the ampulla. Pancreas: No focal mass lesion. No dilatation of the main duct. No intraparenchymal cyst. No peripancreatic edema. Spleen: No splenomegaly. No suspicious focal mass lesion. Adrenals/Urinary Tract: No adrenal nodule or mass. Kidneys unremarkable. No evidence for hydroureter. The urinary bladder appears normal for the degree of distention. Stomach/Bowel: Stomach is unremarkable. No gastric wall thickening. No evidence of outlet obstruction. Duodenum is normally positioned as is the ligament of Treitz. No small bowel wall thickening. No small bowel dilatation. The terminal ileum is normal. The appendix is normal. No gross colonic mass. No colonic wall thickening. Diverticuli are seen scattered along the entire length of the colon without CT findings of diverticulitis. Lymphatic: There is no gastrohepatic or hepatoduodenal ligament lymphadenopathy. No retroperitoneal or mesenteric lymphadenopathy. No pelvic sidewall lymphadenopathy. Reproductive: Prostate gland is enlarged. Other: Trace free fluid is seen in the pelvis (255/10). Musculoskeletal: No worrisome lytic or sclerotic osseous abnormality. Review of the MIP images confirms the above findings. IMPRESSION: 1. No evidence for thoracoabdominal aortic aneurysm or dissection. 2. No evidence for acute pulmonary embolus. 3. Interval increase in size of a large cystic lesion in the posterior right hepatic lobe with increased attenuation of the contents. The margins of  the lesion have become more ill-defined since prior with some irregularity now seen along the posterosuperior margin and an area of adjacent hypo attenuation in the liver parenchyma adjacent to the anterior margin of the lesion. Features could reflect hemorrhage into this cyst. Given the history of leukocytosis, superinfection of a large hepatic cyst is also a consideration. 4. Trace free fluid in the pelvis. 5. Colonic diverticulosis without diverticulitis. 6. 3 mm ground-glass nodule left apex. No follow-up recommended. This recommendation follows the consensus statement: Guidelines for Management of Incidental Pulmonary Nodules Detected on CT Images: From the Fleischner Society 2017; Radiology 2017; 284:228-243. 7.  Emphysema (ICD10-J43.9) and Aortic Atherosclerosis (ICD10-170.0) Electronically Signed   By: Donnal Fusi M.D.   On: 03/26/2024 06:19   DG Chest 2 View Result Date: 03/26/2024 CLINICAL DATA:  Increasing chest pain EXAM: CHEST - 2 VIEW COMPARISON:  01/19/2017 FINDINGS: Cardiac shadow is stable. Mild emphysematous changes and fibrotic changes seen. Scattered calcified granulomas are noted within the left lung stable from the prior study. No bony abnormality is noted. IMPRESSION: Chronic emphysematous changes with fibrosis. No acute abnormality noted. Electronically Signed   By: Violeta Grey M.D.   On: 03/26/2024 02:30    Assessment/plan # Infected hepatic cyst/possible abscess   Plan  - Continue ciprofloxacin  and metronidazole  as is. Qtc 435.  - Labs today including echinococcus IgG  - MRI Liver wwo contrast  - Fu in 10-14 days after MRI   I have personally spent 69 minutes involved in face-to-face and non-face-to-face activities for this patient on the day of the visit. Professional time spent includes the following activities: Preparing to see the patient (review of tests), Obtaining and/or reviewing separately obtained history (admission/discharge record), Performing a medically  appropriate examination and/or evaluation , Ordering medications/tests/procedures, referring and communicating with other health care professionals, Documenting clinical information in the EMR, Independently interpreting results (not separately reported), Communicating results to the patient/family/caregiver, Counseling and educating the patient/family/caregiver and Care coordination (not separately reported).   Of note, portions of this note may have been created with voice recognition  software. While this note has been edited for accuracy, occasional wrong-word or 'sound-a-like' substitutions may have occurred due to the inherent limitations of voice recognition software.   Melvina Stage, MD [facility] 04/14/2024, 10:25 AM

## 2024-04-15 DIAGNOSIS — Z79899 Other long term (current) drug therapy: Secondary | ICD-10-CM | POA: Insufficient documentation

## 2024-04-15 MED ORDER — METRONIDAZOLE 500 MG PO TABS: 500.0000 mg | ORAL_TABLET | Freq: Two times a day (BID) | ORAL | 0 refills | Status: DC

## 2024-04-16 ENCOUNTER — Telehealth: Payer: Self-pay

## 2024-04-16 ENCOUNTER — Other Ambulatory Visit: Payer: Self-pay | Admitting: Infectious Diseases

## 2024-04-16 ENCOUNTER — Ambulatory Visit (HOSPITAL_COMMUNITY)
Admission: RE | Admit: 2024-04-16 | Discharge: 2024-04-16 | Disposition: A | Discharge status: Home / Self Care | Source: Ambulatory Visit | Attending: Infectious Diseases | Admitting: Infectious Diseases

## 2024-04-16 DIAGNOSIS — K7689 Other specified diseases of liver: Secondary | ICD-10-CM

## 2024-04-16 MED ORDER — GADOBUTROL 1 MMOL/ML IV SOLN
6.0000 mL | Freq: Once | INTRAVENOUS | Status: AC | PRN
  Administered 2024-04-16: 6 mL via INTRAVENOUS

## 2024-04-16 NOTE — Telephone Encounter (Signed)
 Patient's son informed of lab results. Patient son also advised of referral.  Patient will be leaving for [place] for 2 months on 05/20/24.  I have asked Daivd Dub to try to get the patient scheduled before June 10 with Hepatology.  Krzysztof Reichelt Adel Holt, CMA

## 2024-04-16 NOTE — Telephone Encounter (Signed)
-----   Message from Melvina Stage sent at 04/16/2024  1:37 PM EDT ----- Please let patient know that MRI showed prior cyst has decreased in size but not completely resolved. The radiologist has also considered concerns for malignancy. Hence, I am ordering a referral to Hepatology.   Labs noted, some elevation in ESR but otherwise non significant.

## 2024-04-18 ENCOUNTER — Telehealth: Payer: Self-pay | Admitting: Internal Medicine

## 2024-04-18 LAB — CBC
HCT: 40.5 % (ref 38.5–50.0)
Hemoglobin: 13.3 g/dL (ref 13.2–17.1)
MCH: 30.4 pg (ref 27.0–33.0)
MCHC: 32.8 g/dL (ref 32.0–36.0)
MCV: 92.7 fL (ref 80.0–100.0)
MPV: 9.7 fL (ref 7.5–12.5)
Platelets: 400 10*3/uL (ref 140–400)
RBC: 4.37 10*6/uL (ref 4.20–5.80)
RDW: 12.8 % (ref 11.0–15.0)
WBC: 8.1 10*3/uL (ref 3.8–10.8)

## 2024-04-18 LAB — COMPREHENSIVE METABOLIC PANEL WITH GFR
AG Ratio: 1.1 (calc) (ref 1.0–2.5)
ALT: 11 U/L (ref 9–46)
AST: 17 U/L (ref 10–35)
Albumin: 3.9 g/dL (ref 3.6–5.1)
Alkaline phosphatase (APISO): 58 U/L (ref 35–144)
BUN: 15 mg/dL (ref 7–25)
CO2: 28 mmol/L (ref 20–32)
Calcium: 9.3 mg/dL (ref 8.6–10.3)
Chloride: 104 mmol/L (ref 98–110)
Creat: 0.74 mg/dL (ref 0.70–1.28)
Globulin: 3.4 g/dL (ref 1.9–3.7)
Glucose, Bld: 96 mg/dL (ref 65–99)
Potassium: 4.1 mmol/L (ref 3.5–5.3)
Sodium: 138 mmol/L (ref 135–146)
Total Bilirubin: 0.5 mg/dL (ref 0.2–1.2)
Total Protein: 7.3 g/dL (ref 6.1–8.1)
eGFR: 95 mL/min/{1.73_m2} (ref >=60)

## 2024-04-18 LAB — C-REACTIVE PROTEIN: CRP: 5.2 mg/L (ref <8.0)

## 2024-04-18 LAB — ECHINOCOCCUS ANTIBODY, IGG: Echinococcus Ab: NEGATIVE

## 2024-04-18 LAB — SEDIMENTATION RATE: Sed Rate: 51 mm/h — ABNORMAL HIGH (ref 0–20)

## 2024-04-18 NOTE — Telephone Encounter (Signed)
 Hepatology declined to see pt. Look like he may need to see surgery. Will defer to Dr. Melven Stable.

## 2024-04-21 ENCOUNTER — Encounter: Payer: Self-pay | Admitting: Infectious Diseases

## 2024-05-01 ENCOUNTER — Encounter: Payer: Self-pay | Admitting: Infectious Diseases

## 2024-05-01 ENCOUNTER — Other Ambulatory Visit: Payer: Self-pay

## 2024-05-01 ENCOUNTER — Telehealth (INDEPENDENT_AMBULATORY_CARE_PROVIDER_SITE_OTHER): Admitting: Infectious Diseases

## 2024-05-01 DIAGNOSIS — Z712 Person consulting for explanation of examination or test findings: Secondary | ICD-10-CM | POA: Insufficient documentation

## 2024-05-01 DIAGNOSIS — Z758 Other problems related to medical facilities and other health care: Secondary | ICD-10-CM

## 2024-05-01 DIAGNOSIS — K7689 Other specified diseases of liver: Secondary | ICD-10-CM

## 2024-05-01 DIAGNOSIS — Z603 Acculturation difficulty: Secondary | ICD-10-CM

## 2024-05-01 DIAGNOSIS — Z79899 Other long term (current) drug therapy: Secondary | ICD-10-CM

## 2024-05-01 NOTE — Progress Notes (Addendum)
 Virtual Visit via Video Note  I connected with Jamie Short on 05/01/24 at  4:00 PM EDT by a video enabled telemedicine application and verified that I am speaking with the correct person using two identifiers.  Location: Patient: Home  Provider: [facility]   I discussed the limitations of evaluation and management by telemedicine and the availability of in person appointments. The patient expressed understanding and agreed to proceed.  [facility]  Patient Active Problem List   Diagnosis Date Noted   Medication management 04/15/2024   Liver abscess 04/14/2024   SIRS (systemic inflammatory response syndrome) (HCC) 03/26/2024   Hepatic cyst 03/26/2024   Hyperlipidemia 03/26/2024   Moderate protein malnutrition (HCC) 03/26/2024   Normocytic anemia 03/26/2024   Abdominal pain 03/26/2024   Chest pain 03/26/2024   GI bleed 02/25/2020   Anemia associated with acute blood loss 02/25/2020   Orthostatic hypotension 02/25/2020   Hemorrhoids 02/25/2020   Rectal bleeding    BPH (benign prostatic hyperplasia) 10/18/2015   Colon cancer screening 10/08/2014   Back pain of thoracolumbar region 09/10/2013   Benign prostatic hyperplasia 09/10/2013    Patient's Medications  New Prescriptions   No medications on file  Previous Medications   ACETAMINOPHEN  (TYLENOL ) 325 MG TABLET    Take 2 tablets (650 mg total) by mouth every 6 (six) hours as needed for mild pain (pain score 1-3) (or Fever >/= 101).   CIPROFLOXACIN  (CIPRO ) 500 MG TABLET    Take 1 tablet (500 mg total) by mouth 2 (two) times daily.   FINASTERIDE  (PROSCAR ) 5 MG TABLET    Take 5 mg by mouth daily.   METRONIDAZOLE  (FLAGYL ) 500 MG TABLET    Take 1 tablet (500 mg total) by mouth 2 (two) times daily.   ONDANSETRON  (ZOFRAN ) 4 MG TABLET    Take 1 tablet (4 mg total) by mouth every 6 (six) hours as needed for nausea.   SIMVASTATIN  (ZOCOR ) 20 MG TABLET    Take 20 mg by mouth at bedtime.  Modified Medications   No medications  on file  Discontinued Medications   No medications on file    History of Present Illness: 73 year old male with prior h/o arthritis, BPH w urinary retention s/p TURP, HTN, HLD,  who is here for HFU after recent hospital admission on 4/16-4/19 for possible infected hepatic cyst.    He initially presented to ED with chest and RUQ abdominal pain for a week with chills, fatigue and malaise. WBC elevated to 22.1. CT CAP dissection study with interval increase in size of a large cystic lesion in the posterior rt hepatic lobe with increased attenuation of contents. It was drained by IR with 450 ml of serosanguinous fluid. Cx NG ( was on zosyn ). Cytology with no malignant cells. Discharged on 4/19 on ciprofloxacin  and metronidazole  for a prolonged course and planned for repeat imaging.    5/6 Accompanied by son who helps with interpretation as well as used video interpreter. Taking PO ciprofloxacin  and metronidazole  without missimh doses or any concerns. Denies abdominal pain, nausea, vomiting, or diarrhee. Appetite is OK.     Ex smoker, denies alcohol and recreational drug use. Moved from [place] to [place]  in 2001 and lives with son. Thinks he had prior imaging done in [place] that showed liver cyst but was told not to worry about.  5/22 He was referred to Hepatology after MRI liver with concerns for neoplasm. Hepatology recommended to refer to surgery for possible removal of cysts. He was  seen by General surgery Dr Leighton Punches 5/14, no surgical intervention recommended, plan to get tumor markers and if normal, Liver MRI in 6 months. Liver abscess was thought to be less likely as well and no need for further antibiotics.  Spoke with help of son for interpretation, he is taking ciprofloxacin  and metronidazole  as prescribed with no concerns. Denies nausea, vomiting, diarrhea. On and off LUQ pain. Appetite is good.   ROS all systems reviewed with pertinent positives and negatives as listed above   Past Medical  History:  Diagnosis Date   Arthritis    lower legs   BPH (benign prostatic hyperplasia)    Foley catheter in place    Urinary retention    Wears dentures    Wears glasses    Past Surgical History:  Procedure Laterality Date   COLONOSCOPY WITH PROPOFOL  N/A 02/26/2020   Procedure: COLONOSCOPY WITH PROPOFOL ;  Surgeon: Nannette Babe, MD;  Location: [facility];  Service: Gastroenterology;  Laterality: N/A;   LAPAROSCOPIC CHOLECYSTECTOMY  2006 approx   TRANSURETHRAL RESECTION OF PROSTATE N/A 10/18/2015   Procedure: TRANSURETHRAL RESECTION OF THE PROSTATE WITH GYRUS 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  Allergies  Allergen Reactions   Other Other (See Comments)    Hot, spicy chili and foods = Not tolerated    Health Maintenance  Topic Date Due   Medicare Annual Wellness (AWV)  Never done   COVID-19 Vaccine (1) Never done   Hepatitis C Screening  Never done   Zoster Vaccines- Shingrix (1 of 2) Never done   Pneumonia Vaccine 29+ Years old (2 of 2 - PCV) 08/01/2016   DTaP/Tdap/Td (2 - Td or Tdap) 02/25/2024   INFLUENZA VACCINE  07/11/2024   Lung Cancer Screening  03/26/2025   Colonoscopy  02/25/2030   HPV VACCINES  Aged Out   Meningococcal B Vaccine  Aged Out   Imaging  5/7 MRI Liver 1. Following image guided aspiration, significantly diminished size of a large, multiseptated cystic lesion of the posterior liver dome, centered in hepatic segment VII, on today's examination measuring 6.2 x 6.0 cm. This demonstrates an  unusual hypoenhancing rim of tissue without internal solid component. As previously reported, this may reflect a simple hepatic parenchymal cyst complicated by hemorrhage or infection, or alternately a biliary cystadenoma or other cystic neoplasm. Correlate with laboratory findings. 2. Numerous additional simple and thinly septated cysts throughout the liver, several of which are quite sizable. 3. Status post cholecystectomy. Postoperative biliary ductal dilatation. 4. Pancolonic diverticulosis.   Aortic Atherosclerosis (ICD10-I70.0).   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 Plan to get tumor markers and MRI liver in 6 months if normal tumor markers - 5/5 labs reviewed and discussed, CBC and CMP unremarkable, elevated ESR but normal CRP. Echinococcus IgG negative -  5/7 MRI liver findings discussed with some diminished size of   large cyst   Plan  - stop antibiotics today due to abscess thought to be less likely and has completed more than a month of antibiotics  - Fu in 2-3 weeks to monitor off antibiotics - Fu with surgery as instructed   Follow Up Instructions: 2-3 weeks    I discussed the assessment and treatment plan with the patient. The patient was provided an opportunity to ask questions and all were answered. The patient agreed with the plan and demonstrated an understanding of the instructions.   The patient was advised to call back or seek an in-person evaluation if the symptoms worsen or if the condition fails to improve as anticipated.  I provided 20 minutes of non-face-to-face time during this encounter.  Of note, portions of this note may have been created with voice recognition software. While this note has been edited for accuracy, occasional  wrong-word or 'sound-a-like' substitutions may have occurred due to the inherent limitations of voice recognition software.   Melvina Stage, MD [facility] [phone] pager   [phone] cell 05/01/2024, 3:17 PM

## 2024-05-08 ENCOUNTER — Ambulatory Visit: Admitting: Infectious Diseases

## 2024-05-19 ENCOUNTER — Ambulatory Visit (INDEPENDENT_AMBULATORY_CARE_PROVIDER_SITE_OTHER): Admitting: Infectious Diseases

## 2024-05-19 ENCOUNTER — Encounter: Payer: Self-pay | Admitting: Infectious Diseases

## 2024-05-19 ENCOUNTER — Other Ambulatory Visit: Payer: Self-pay

## 2024-05-19 VITALS — BP 124/75 | HR 79 | Temp 98.1°F | Ht 64.0 in | Wt 135.0 lb

## 2024-05-19 DIAGNOSIS — K7689 Other specified diseases of liver: Secondary | ICD-10-CM

## 2024-05-19 DIAGNOSIS — Z758 Other problems related to medical facilities and other health care: Secondary | ICD-10-CM | POA: Diagnosis not present

## 2024-05-19 DIAGNOSIS — Z603 Acculturation difficulty: Secondary | ICD-10-CM

## 2024-05-19 DIAGNOSIS — Z79899 Other long term (current) drug therapy: Secondary | ICD-10-CM | POA: Diagnosis not present

## 2024-05-19 NOTE — Progress Notes (Signed)
 Patient Active Problem List   Diagnosis Date Noted   Encounter to discuss test results 05/01/2024   Language barrier 05/01/2024   Medication management 04/15/2024   Liver abscess 04/14/2024   SIRS (systemic inflammatory response syndrome) (HCC) 03/26/2024   Hepatic cyst 03/26/2024   Hyperlipidemia 03/26/2024   Moderate protein malnutrition (HCC) 03/26/2024   Normocytic anemia 03/26/2024   Abdominal pain 03/26/2024   Chest pain 03/26/2024   GI bleed 02/25/2020   Anemia associated with acute blood loss 02/25/2020   Orthostatic hypotension 02/25/2020   Hemorrhoids 02/25/2020   Rectal bleeding    BPH (benign prostatic hyperplasia) 10/18/2015   Colon cancer screening 10/08/2014   Back pain of thoracolumbar region 09/10/2013   Benign prostatic hyperplasia 09/10/2013    Current Outpatient Medications on File Prior to Visit  Medication Sig Dispense Refill   acetaminophen  (TYLENOL ) 325 MG tablet Take 2 tablets (650 mg total) by mouth every 6 (six) hours as needed for mild pain (pain score 1-3) (or Fever >/= 101).     finasteride  (PROSCAR ) 5 MG tablet Take 5 mg by mouth daily.     Probiotic Product (PROBIOTIC DAILY PO) Take by mouth.     simvastatin  (ZOCOR ) 20 MG tablet Take 20 mg by mouth at bedtime.     ondansetron  (ZOFRAN ) 4 MG tablet Take 1 tablet (4 mg total) by mouth every 6 (six) hours as needed for nausea. (Patient not taking: Reported on 05/19/2024) 20 tablet 0   No current facility-administered medications on file prior to visit.    Subjective: Discussed the use of AI scribe software for clinical note transcription with the patient, who gave verbal consent to proceed.   73 year old male with prior h/o arthritis, BPH w urinary retention s/p TURP, HTN, HLD,  who is here for HFU after recent hospital admission on 4/16-4/19 for possible infected hepatic cyst.    He initially presented to ED with chest and RUQ abdominal pain for a week with chills, fatigue and malaise. WBC  elevated to 22.1. CT CAP dissection study with interval increase in size of a large cystic lesion in the posterior rt hepatic lobe with increased attenuation of contents. It was drained by IR with 450 ml of serosanguinous fluid. Cx NG ( was on zosyn ). Cytology with no malignant cells. Discharged on 4/19 on ciprofloxacin  and metronidazole  for a prolonged course and planned for repeat imaging.    5/6 Accompanied by son who helps with interpretation as well as used video interpreter. Taking PO ciprofloxacin  and metronidazole  without missimh doses or any concerns. Denies abdominal pain, nausea, vomiting, or diarrhee. Appetite is OK.     Ex smoker, denies alcohol and recreational drug use. Moved from [place] to [place]  in 2001 and lives with son. Thinks he had prior imaging done in [place] that showed liver cyst but was told not to worry about.   5/22 He was referred to Hepatology after MRI liver with concerns for neoplasm. Hepatology recommended to refer to surgery for possible removal of cysts. He was seen by General surgery Dr Leighton Punches 5/14, no surgical intervention recommended, plan to get tumor markers and if normal, Liver MRI in 6 months. Liver abscess was thought to be less likely as well and no need for further antibiotics.   Spoke with help of son for interpretation, he is taking ciprofloxacin  and metronidazole  as prescribed with no concerns. Denies nausea, vomiting, diarrhea. On and off LUQ pain. Appetite is good.   6/9 Spoke with son  with the help of interpreter. Stopped taking antibiotics since last visit. Denies fevers, chills, nausea, vomiting or abdominal pain. Appetite is good. Doing well. Aware about the fu with surgery.   Review of Systems: all systems reviewed with pertinent positives and negative as listed above  Past Medical History:  Diagnosis Date   Arthritis    lower legs   BPH (benign prostatic hyperplasia)    Foley catheter in place    Urinary retention    Wears dentures     Wears glasses    Past Surgical History:  Procedure Laterality Date   COLONOSCOPY WITH PROPOFOL  N/A 02/26/2020   Procedure: COLONOSCOPY WITH PROPOFOL ;  Surgeon: Nannette Babe, MD;  Location: [facility];  Service: Gastroenterology;  Laterality: N/A;   LAPAROSCOPIC CHOLECYSTECTOMY  2006 approx   TRANSURETHRAL RESECTION OF PROSTATE N/A 10/18/2015   Procedure: TRANSURETHRAL RESECTION OF THE PROSTATE WITH GYRUS INSTRUMENTS WITH RESECTION OF BLADDER WALL TUMOR;  Surgeon: Marco Severs, MD;  Location: [facility];  Service: Urology;  Laterality: N/A;   Social History   Tobacco Use   Smoking status: Former    Current packs/day: 0.50    Average packs/day: 0.5 packs/day for 40.0 years (20.0 ttl pk-yrs)    Types: Cigarettes   Smokeless tobacco: Never  Vaping Use   Vaping status: Never Used  Substance Use Topics   Alcohol use: No   Drug use: No    No family history on file.  Allergies  Allergen Reactions   Other Other (See Comments)    Hot, spicy chili and foods = Not tolerated    Health Maintenance  Topic Date Due   Medicare Annual Wellness (AWV)  Never done   COVID-19 Vaccine (1) Never done   Hepatitis C Screening  Never done   Zoster Vaccines- Shingrix (1 of 2) Never done   Pneumonia Vaccine 74+ Years old (2 of 2 - PCV) 08/01/2016   DTaP/Tdap/Td (2 - Td or Tdap) 02/25/2024   INFLUENZA VACCINE  07/11/2024   Lung Cancer Screening  03/26/2025   Colonoscopy  02/25/2030   HPV VACCINES  Aged Out   Meningococcal B Vaccine  Aged Out    Objective: BP 124/75   Pulse 79   Temp 98.1 F (36.7 C) (Temporal)   Ht 5' 4 (1.626 m)   Wt 135 lb (61.2 kg)   SpO2 95%   BMI 23.17 kg/m    Physical Exam Constitutional:      Appearance: Normal appearance.  HENT:     Head: Normocephalic and atraumatic.      Mouth: Mucous membranes are moist.  Eyes:    Conjunctiva/sclera: Conjunctivae normal.     Pupils: Pupils are equal, round, and b/l symmetrical     Cardiovascular:     Rate and Rhythm: Normal rate and regular rhythm.     Heart sounds:   Pulmonary:     Effort: Pulmonary effort is normal.     Breath sounds:   Abdominal:     General: Non distended     Palpations:  Musculoskeletal:        General: Normal range of motion.   Skin:    General: Skin is warm and dry.     Comments:  Neurological:     General: grossly non focal     Mental Status: awake, alert and oriented to person, place, and time.   Psychiatric:        Mood and Affect: Mood normal.   Lab Results Lab Results  Component Value Date   WBC 8.1 04/14/2024   HGB 13.3 04/14/2024   HCT 40.5 04/14/2024   MCV 92.7 04/14/2024   PLT 400 04/14/2024    Lab Results  Component Value Date   CREATININE 0.74 04/14/2024   BUN 15 04/14/2024   NA 138 04/14/2024   K 4.1 04/14/2024   CL 104 04/14/2024   CO2 28 04/14/2024    Lab Results  Component Value Date   ALT 11 04/14/2024   AST 17 04/14/2024   ALKPHOS 97 03/27/2024   BILITOT 0.5 04/14/2024    Lab Results  Component Value Date   CHOL 157 10/10/2013   HDL 45 10/10/2013   LDLCALC 96 10/10/2013   TRIG 81 10/10/2013   CHOLHDL 3.5 10/10/2013   No results found for: LABRPR, RPRTITER No results found for: HIV1RNAQUANT, HIV1RNAVL, CD4TABS  Assessment/Plan # 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 Plan to get tumor markers and MRI liver in 6 months if normal tumor markers - 5/5 labs reviewed and discussed, CBC and CMP unremarkable, elevated ESR but normal CRP. Echinococcus IgG negative - 5/7 MRI liver findings discussed with some diminished size of   large cyst - 5/22 stopped antibiotics   Plan  - fu as needed - fu with surgery as instructed.  - discussed symptoms and signs of recurrence of infection to seek attention   I spent 15 minutes involved in face-to-face and  non-face-to-face activities for this patient on the day of the visit. Professional time spent includes the following activities: Preparing to see the patient (review of tests), Performing a medically appropriate examination and evaluation, Documenting clinical information in the EMR, Independently interpreting results (not separately reported), Communicating results to the patient/family member, Counseling and educating the patient/family member and Care coordination (not separately reported).   Of note, portions of this note may have been created with voice recognition software. While this note has been edited for accuracy, occasional wrong-word or 'sound-a-like' substitutions may have occurred due to the inherent limitations of voice recognition software.   Melvina Stage, MD [facility] 05/19/2024, 10:17 AM

## 2024-10-02 ENCOUNTER — Other Ambulatory Visit (HOSPITAL_COMMUNITY): Payer: Self-pay | Admitting: Surgery

## 2024-10-02 DIAGNOSIS — K7689 Other specified diseases of liver: Secondary | ICD-10-CM

## 2024-10-22 ENCOUNTER — Ambulatory Visit (HOSPITAL_COMMUNITY)
Admission: RE | Admit: 2024-10-22 | Discharge: 2024-10-22 | Disposition: A | Discharge status: Home / Self Care | Source: Ambulatory Visit | Attending: Surgery | Admitting: Surgery

## 2024-10-22 DIAGNOSIS — K7689 Other specified diseases of liver: Secondary | ICD-10-CM | POA: Insufficient documentation

## 2024-10-22 MED ORDER — GADOBUTROL 1 MMOL/ML IV SOLN
6.0000 mL | Freq: Once | INTRAVENOUS | Status: AC | PRN
  Administered 2024-10-22: 6 mL via INTRAVENOUS
